# Patient Record
Sex: Female | Born: 1950 | Race: White | Hispanic: No | Marital: Married | Smoking: Never smoker
Health system: Southern US, Community
[De-identification: ages and names within clinical notes are randomized; demographics above are authoritative.]

## PROBLEM LIST (undated history)

## (undated) DIAGNOSIS — R112 Nausea with vomiting, unspecified: Secondary | ICD-10-CM

## (undated) DIAGNOSIS — Z9889 Other specified postprocedural states: Secondary | ICD-10-CM

## (undated) DIAGNOSIS — F419 Anxiety disorder, unspecified: Secondary | ICD-10-CM

## (undated) DIAGNOSIS — E7800 Pure hypercholesterolemia, unspecified: Secondary | ICD-10-CM

## (undated) DIAGNOSIS — G250 Essential tremor: Secondary | ICD-10-CM

## (undated) HISTORY — DX: Pure hypercholesterolemia, unspecified: E78.00

## (undated) HISTORY — DX: Anxiety disorder, unspecified: F41.9

## (undated) HISTORY — PX: TUBAL LIGATION: SHX77

---

## 1999-10-28 ENCOUNTER — Encounter: Admission: RE | Admit: 1999-10-28 | Discharge: 1999-11-19 | Payer: Self-pay | Admitting: Orthopaedic Surgery

## 2004-07-04 ENCOUNTER — Ambulatory Visit: Payer: Self-pay | Admitting: Family Medicine

## 2004-10-30 ENCOUNTER — Ambulatory Visit: Payer: Self-pay | Admitting: Family Medicine

## 2005-04-14 ENCOUNTER — Ambulatory Visit: Payer: Self-pay | Admitting: Family Medicine

## 2005-06-03 ENCOUNTER — Ambulatory Visit: Payer: Self-pay | Admitting: Family Medicine

## 2006-03-31 ENCOUNTER — Ambulatory Visit: Payer: Self-pay | Admitting: Family Medicine

## 2014-01-02 DIAGNOSIS — F411 Generalized anxiety disorder: Secondary | ICD-10-CM | POA: Insufficient documentation

## 2014-01-30 DIAGNOSIS — J069 Acute upper respiratory infection, unspecified: Secondary | ICD-10-CM | POA: Insufficient documentation

## 2014-05-01 DIAGNOSIS — E785 Hyperlipidemia, unspecified: Secondary | ICD-10-CM | POA: Insufficient documentation

## 2014-08-21 ENCOUNTER — Ambulatory Visit: Payer: 59 | Admitting: Neurology

## 2014-09-04 ENCOUNTER — Encounter: Payer: Self-pay | Admitting: Neurology

## 2014-09-04 ENCOUNTER — Ambulatory Visit (INDEPENDENT_AMBULATORY_CARE_PROVIDER_SITE_OTHER): Payer: 59 | Admitting: Neurology

## 2014-09-04 VITALS — BP 155/82 | HR 79 | Temp 97.9°F | Ht 67.0 in | Wt 152.0 lb

## 2014-09-04 DIAGNOSIS — G250 Essential tremor: Secondary | ICD-10-CM | POA: Insufficient documentation

## 2014-09-04 DIAGNOSIS — F329 Major depressive disorder, single episode, unspecified: Secondary | ICD-10-CM

## 2014-09-04 DIAGNOSIS — F32A Depression, unspecified: Secondary | ICD-10-CM | POA: Insufficient documentation

## 2014-09-04 DIAGNOSIS — R251 Tremor, unspecified: Secondary | ICD-10-CM

## 2014-09-04 DIAGNOSIS — F411 Generalized anxiety disorder: Secondary | ICD-10-CM | POA: Insufficient documentation

## 2014-09-04 DIAGNOSIS — G4700 Insomnia, unspecified: Secondary | ICD-10-CM | POA: Diagnosis not present

## 2014-09-04 MED ORDER — PROPRANOLOL HCL ER 80 MG PO CP24: 80.0000 mg | ORAL_CAPSULE | Freq: Every day | ORAL | Status: DC

## 2014-09-04 NOTE — Progress Notes (Signed)
[facility]    Provider:  Dr Jaynee Eagles Referring Provider: Lars Mage Primary Care Physician:  No primary care provider on file.  CC:  tremor  HPI:  Rachael Bryant is a 64 y.o. female here as a referral from for tremor. PMHx depression, anxiety, HLD. Tremor started several years ago and worsening. It is in both hands. It is low, fast tremor. Worse with use such as when she has a cup of coffee, she has to hold it with both hands to keep it from sloshing. Worse with anxiety. Both daughters have tremors as well. One daughter is being treated with propranolol for her tremor. She is worried about Parkinson's disease because her sister has it. Patient's voice not hypophonic. No drooling. No falls, or shuffling, not slow. No loss of smell, no constipation. The shaking is interfering with her writing however. She doesn't drink acohol. Never tried anything for the tremor.  She takes benadryl every night for years to sleep. Feels tremor is worsening. Is present most of the time. No other focal neurologic complaints.    Review of Systems: Patient complains of symptoms per HPI as well as the following symptoms: insomnia, sleepiness, not enough sleep, joint pain. Pertinent negatives per HPI. All others negative.   History   Social History  . Marital Status: Married    Spouse Name: Remo Lipps   . Number of Children: 2  . Years of Education: 12   Occupational History  . Not on file.   Social History Main Topics  . Smoking status: Never Smoker   . Smokeless tobacco: Not on file  . Alcohol Use: No  . Drug Use: No  . Sexual Activity: Not on file   Other Topics Concern  . Not on file   Social History Narrative   Lives at home with husband, Remo Lipps.   Caffeine use: Drinks coffee/soda (7 cups per week)   Drinks 2 Liters per week    Family History  Problem Relation Age of Onset  . Heart attack Father   . Diabetes Mother   . Pulmonary fibrosis Mother     Past  Medical History  Diagnosis Date  . High cholesterol   . Anxiety     Past Surgical History  Procedure Laterality Date  . Tubal ligation      Current Outpatient Prescriptions  Medication Sig Dispense Refill  . citalopram (CELEXA) 20 MG tablet Take 0.5 tablets by mouth daily.    . diphenhydrAMINE (BENADRYL) 25 mg capsule Take 2 tablets by mouth daily.    . simvastatin (ZOCOR) 20 MG tablet Take 20 mg by mouth daily.    . propranolol ER (INDERAL LA) 80 MG 24 hr capsule Take 1 capsule (80 mg total) by mouth daily. 30 capsule 6   No current facility-administered medications for this visit.    Allergies as of 09/04/2014 - Review Complete 09/04/2014  Allergen Reaction Noted  . Acetaminophen Hives 09/04/2014  . Ibuprofen Hives 09/04/2014  . Other Hives 09/04/2014  . Codeine Nausea Only 09/04/2014    Vitals: BP 155/82 mmHg  Pulse 79  Temp(Src) 97.9 F (36.6 C) (Oral)  Ht 5\' 7"  (1.702 m)  Wt 152 lb (68.947 kg)  BMI 23.80 kg/m2 Last Weight:  Wt Readings from Last 1 Encounters:  09/04/14 152 lb (68.947 kg)   Last Height:   Ht Readings from Last 1 Encounters:  09/04/14 5\' 7"  (1.702 m)    Physical exam: Exam: Gen: NAD, conversant, well nourised, well groomed  CV: RRR, no MRG. No Carotid Bruits. No peripheral edema, warm, nontender Eyes: Conjunctivae clear without exudates or hemorrhage  Neuro: Detailed Neurologic Exam  Speech:    Speech is normal; fluent and spontaneous with normal comprehension.  Cognition:    The patient is oriented to person, place, and time;     recent and remote memory intact;     language fluent;     normal attention, concentration,     fund of knowledge Cranial Nerves:    The pupils are equal, round, and reactive to light. The fundi are normal and spontaneous venous pulsations are present. Visual fields are full to finger confrontation. Extraocular movements are intact. Trigeminal sensation is intact and the muscles of  mastication are normal. The face is symmetric. The palate elevates in the midline. Hearing intact. Voice is normal. Shoulder shrug is normal. The tongue has normal motion without fasciculations.   Coordination:    Normal finger to nose and heel to shin. Normal rapid alternating movements.   Gait:    Heel-toe and tandem gait are normal.   Motor Observation:    Postural tremor, high frequency, low amplitude Tone:    Normal muscle tone.    Posture:    Posture is normal. normal erect    Strength:    Strength is V/V in the upper and lower limbs.      Sensation: intact to LT     Reflex Exam:  DTR's:    Deep tendon reflexes in the upper and lower extremities are normal bilaterally.   Toes:    The toes are downgoing bilaterally.   Clonus:    Clonus is absent.      Assessment/Plan:  64 year old very lovely female. She has what appears to be an essential tremor. Advised to stop any caffeine use. Can start propranolol and see if that helps. Will order TSH. Discussed common side effects such as fatigue, dizziness, constipation, hypotension, bradycardia as well as serious cardiac complications. Will start low. Stop for anything concerning.  Sarina Ill, MD  [facility] [address] [place], [place] [address]  Phone [phone] Fax [phone]

## 2014-09-04 NOTE — Patient Instructions (Signed)
Overall you are doing fairly well but I do want to suggest a few things today:   Remember to drink plenty of fluid, eat healthy meals and do not skip any meals. Try to eat protein with a every meal and eat a healthy snack such as fruit or nuts in between meals. Try to keep a regular sleep-wake schedule and try to exercise daily, particularly in the form of walking, 20-30 minutes a day, if you can.   As far as your medications are concerned, I would like to suggest: Inderal 80mg  daily  I would like to see you back in 4 months, sooner if we need to. Please call us with any interim questions, concerns, problems, updates or refill requests.   Our phone number is [phone]. We also have an after hours call service for urgent matters and there is a physician on-call for urgent questions. For any emergencies you know to call 911 or go to the nearest emergency room

## 2014-09-05 LAB — TSH: TSH: 0.33 u[IU]/mL — AB (ref 0.450–4.500)

## 2014-09-06 ENCOUNTER — Telehealth: Payer: Self-pay | Admitting: *Deleted

## 2014-09-06 NOTE — Telephone Encounter (Signed)
Left VM for the pt to call back about lab results. Gave [facility] phone number and office hours.

## 2014-09-06 NOTE — Telephone Encounter (Signed)
Spoke w/ patient about thyroid results. Told her it was low at 0.33 and normal is 0.45-4.50 which is considered hyperthyroidism. She needs to f/u with her PCP and call them. Told her Dr. Jaynee Eagles forwarded the results to her PCP as well. She verbalized understanding.

## 2014-11-27 DIAGNOSIS — R7309 Other abnormal glucose: Secondary | ICD-10-CM | POA: Insufficient documentation

## 2015-01-09 ENCOUNTER — Encounter: Payer: Self-pay | Admitting: Neurology

## 2015-01-09 ENCOUNTER — Ambulatory Visit (INDEPENDENT_AMBULATORY_CARE_PROVIDER_SITE_OTHER): Payer: 59 | Admitting: Neurology

## 2015-01-09 VITALS — BP 133/83 | HR 66 | Ht 67.0 in | Wt 157.2 lb

## 2015-01-09 DIAGNOSIS — G250 Essential tremor: Secondary | ICD-10-CM | POA: Diagnosis not present

## 2015-01-09 MED ORDER — PROPRANOLOL HCL ER 80 MG PO CP24: 80.0000 mg | ORAL_CAPSULE | Freq: Every day | ORAL | Status: DC

## 2015-01-09 NOTE — Progress Notes (Signed)
[facility]    Provider:  Dr Jaynee Eagles Referring Provider: No ref. provider found Primary Care Physician:  No primary care provider on file.  CC: tremor  Interval update 01/09/2015: Tremor is improved. She is very happy. No lightheadedness, no chest pain. She has gained a few pounds. Otherwise no side effects.  HPI: Rachael Bryant is a 64 y.o. female here as a referral from for tremor. PMHx depression, anxiety, HLD. Tremor started several years ago and worsening. It is in both hands. It is low, fast tremor. Worse with use such as when she has a cup of coffee, she has to hold it with both hands to keep it from sloshing. Worse with anxiety. Both daughters have tremors as well. One daughter is being treated with propranolol for her tremor. She is worried about Parkinson's disease because her sister has it. Patient's voice not hypophonic. No drooling. No falls, or shuffling, not slow. No loss of smell, no constipation. The shaking is interfering with her writing however. She doesn't drink acohol. Never tried anything for the tremor. She takes benadryl every night for years to sleep. Feels tremor is worsening. Is present most of the time. No other focal neurologic complaints.    Review of Systems: Patient complains of symptoms per HPI as well as the following symptoms: insomnia, sleepiness, not enough sleep, joint pain. Pertinent negatives per HPI. All others negative.   Social History   Social History  . Marital Status: Married    Spouse Name: Remo Lipps   . Number of Children: 2  . Years of Education: 12   Occupational History  . Not on file.   Social History Main Topics  . Smoking status: Never Smoker   . Smokeless tobacco: Not on file  . Alcohol Use: No  . Drug Use: No  . Sexual Activity: Not on file   Other Topics Concern  . Not on file   Social History Narrative   Lives at home with husband, Remo Lipps.   Caffeine use: Drinks coffee/soda (7 cups per week)   Drinks 2 Liters per week    Family History  Problem Relation Age of Onset  . Heart attack Father   . Diabetes Mother   . Pulmonary fibrosis Mother   . Parkinsonism Sister     Past Medical History  Diagnosis Date  . High cholesterol   . Anxiety     Past Surgical History  Procedure Laterality Date  . Tubal ligation      Current Outpatient Prescriptions  Medication Sig Dispense Refill  . citalopram (CELEXA) 20 MG tablet Take 0.5 tablets by mouth daily.    . diphenhydrAMINE (BENADRYL) 25 mg capsule Take 2 tablets by mouth daily.    . propranolol ER (INDERAL LA) 80 MG 24 hr capsule Take 1 capsule (80 mg total) by mouth daily. 30 capsule 6  . simvastatin (ZOCOR) 20 MG tablet Take 20 mg by mouth daily.     No current facility-administered medications for this visit.    Allergies as of 01/09/2015 - Review Complete 09/04/2014  Allergen Reaction Noted  . Acetaminophen Hives 09/04/2014  . Ibuprofen Hives 09/04/2014  . Other Hives 09/04/2014  . Codeine Nausea Only 09/04/2014    Vitals: There were no vitals taken for this visit. Last Weight:  Wt Readings from Last 1 Encounters:  09/04/14 152 lb (68.947 kg)   Last Height:   Ht Readings from Last 1 Encounters:  09/04/14 5\' 7"  (1.702 m)    Cranial Nerves:  The pupils are equal, round, and reactive to light. The fundi are normal and spontaneous venous pulsations are present. Visual fields are full to finger confrontation. Extraocular movements are intact. Trigeminal sensation is intact and the muscles of mastication are normal. The face is symmetric. The palate elevates in the midline. Hearing intact. Voice is normal. Shoulder shrug is normal. The tongue has normal motion without fasciculations.   Coordination:  Normal finger to nose and heel to shin. Normal rapid alternating movements.   Gait:  Heel-toe and tandem gait are normal.   Motor Observation:  Postural tremor, high frequency, low amplitude  improved Tone:  Normal muscle tone.   Posture:  Posture is normal. normal erect   Strength:  Strength is V/V in the upper and lower limbs.    Sensation: intact to LT   Reflex Exam:  DTR's:  Deep tendon reflexes in the upper and lower extremities are normal bilaterally.  Toes:  The toes are downgoing bilaterally.  Clonus:  Clonus is absent.     Assessment/Plan: 64 year old very lovely female. She has what appears to be an essential tremor. Advised to stop any caffeine use. Can start propranolol and see if that helps. Will order TSH. Discussed common side effects such as fatigue, dizziness, constipation, hypotension, bradycardia as well as serious cardiac complications. Will start low. Stop for anything concerning.  Propranolol helping. She is happy. No side effects. Continue at current dose. If needed, can increase to 120mg  ER.   Sarina Ill, MD  [facility] [address] [place], [place] [address]  Phone [phone] Fax [phone]  A total of 15 minutes was spent face-to-face with this patient. Over half this time was spent on counseling patient on the essential tremor diagnosis and different diagnostic and therapeutic options available.

## 2015-08-06 ENCOUNTER — Other Ambulatory Visit: Payer: Self-pay

## 2015-08-06 MED ORDER — PROPRANOLOL HCL ER 80 MG PO CP24: 80.0000 mg | ORAL_CAPSULE | Freq: Every day | ORAL | Status: DC

## 2015-08-07 ENCOUNTER — Encounter: Payer: Self-pay | Admitting: *Deleted

## 2015-08-07 NOTE — Progress Notes (Signed)
Faxed printed rx propranolol to pt pharmacy. Fax: [phone]. Received confirmation.

## 2016-02-16 ENCOUNTER — Other Ambulatory Visit: Payer: Self-pay | Admitting: Neurology

## 2016-03-03 ENCOUNTER — Other Ambulatory Visit: Payer: Self-pay | Admitting: Neurology

## 2016-03-03 NOTE — Telephone Encounter (Signed)
Called and spoke to pt. Patient states she is going great, no new or worsening symptoms. Advised per AA,MD we can send in 3 month supply, but any further refills need to be requested from her PCP or she needs to come back to our office for a f/u to receive further refills. She verbalized understanding.

## 2016-05-28 ENCOUNTER — Other Ambulatory Visit: Payer: Self-pay | Admitting: Neurology

## 2016-09-25 ENCOUNTER — Encounter (INDEPENDENT_AMBULATORY_CARE_PROVIDER_SITE_OTHER): Payer: BLUE CROSS/BLUE SHIELD | Admitting: Ophthalmology

## 2016-09-25 DIAGNOSIS — H43813 Vitreous degeneration, bilateral: Secondary | ICD-10-CM

## 2016-09-25 DIAGNOSIS — H353211 Exudative age-related macular degeneration, right eye, with active choroidal neovascularization: Secondary | ICD-10-CM

## 2016-09-25 DIAGNOSIS — H2513 Age-related nuclear cataract, bilateral: Secondary | ICD-10-CM | POA: Diagnosis not present

## 2016-10-22 ENCOUNTER — Encounter (INDEPENDENT_AMBULATORY_CARE_PROVIDER_SITE_OTHER): Payer: BLUE CROSS/BLUE SHIELD | Admitting: Ophthalmology

## 2016-10-22 DIAGNOSIS — H43813 Vitreous degeneration, bilateral: Secondary | ICD-10-CM

## 2016-10-22 DIAGNOSIS — H353211 Exudative age-related macular degeneration, right eye, with active choroidal neovascularization: Secondary | ICD-10-CM | POA: Diagnosis not present

## 2016-10-22 DIAGNOSIS — H353121 Nonexudative age-related macular degeneration, left eye, early dry stage: Secondary | ICD-10-CM | POA: Diagnosis not present

## 2016-10-22 DIAGNOSIS — H2513 Age-related nuclear cataract, bilateral: Secondary | ICD-10-CM

## 2016-11-19 ENCOUNTER — Encounter (INDEPENDENT_AMBULATORY_CARE_PROVIDER_SITE_OTHER): Payer: BLUE CROSS/BLUE SHIELD | Admitting: Ophthalmology

## 2016-11-19 DIAGNOSIS — H2513 Age-related nuclear cataract, bilateral: Secondary | ICD-10-CM

## 2016-11-19 DIAGNOSIS — H353211 Exudative age-related macular degeneration, right eye, with active choroidal neovascularization: Secondary | ICD-10-CM

## 2016-11-19 DIAGNOSIS — H43813 Vitreous degeneration, bilateral: Secondary | ICD-10-CM

## 2016-12-17 ENCOUNTER — Encounter (INDEPENDENT_AMBULATORY_CARE_PROVIDER_SITE_OTHER): Payer: BLUE CROSS/BLUE SHIELD | Admitting: Ophthalmology

## 2016-12-17 DIAGNOSIS — H353211 Exudative age-related macular degeneration, right eye, with active choroidal neovascularization: Secondary | ICD-10-CM

## 2016-12-17 DIAGNOSIS — H2513 Age-related nuclear cataract, bilateral: Secondary | ICD-10-CM | POA: Diagnosis not present

## 2016-12-17 DIAGNOSIS — H353121 Nonexudative age-related macular degeneration, left eye, early dry stage: Secondary | ICD-10-CM

## 2016-12-17 DIAGNOSIS — H43813 Vitreous degeneration, bilateral: Secondary | ICD-10-CM

## 2017-01-14 ENCOUNTER — Encounter (INDEPENDENT_AMBULATORY_CARE_PROVIDER_SITE_OTHER): Payer: BLUE CROSS/BLUE SHIELD | Admitting: Ophthalmology

## 2017-01-14 DIAGNOSIS — H2513 Age-related nuclear cataract, bilateral: Secondary | ICD-10-CM

## 2017-01-14 DIAGNOSIS — H353211 Exudative age-related macular degeneration, right eye, with active choroidal neovascularization: Secondary | ICD-10-CM

## 2017-01-14 DIAGNOSIS — H353121 Nonexudative age-related macular degeneration, left eye, early dry stage: Secondary | ICD-10-CM

## 2017-01-14 DIAGNOSIS — H43813 Vitreous degeneration, bilateral: Secondary | ICD-10-CM | POA: Diagnosis not present

## 2017-02-11 ENCOUNTER — Encounter (INDEPENDENT_AMBULATORY_CARE_PROVIDER_SITE_OTHER): Payer: BLUE CROSS/BLUE SHIELD | Admitting: Ophthalmology

## 2017-02-11 DIAGNOSIS — H353121 Nonexudative age-related macular degeneration, left eye, early dry stage: Secondary | ICD-10-CM

## 2017-02-11 DIAGNOSIS — H2513 Age-related nuclear cataract, bilateral: Secondary | ICD-10-CM

## 2017-02-11 DIAGNOSIS — H353211 Exudative age-related macular degeneration, right eye, with active choroidal neovascularization: Secondary | ICD-10-CM | POA: Diagnosis not present

## 2017-02-11 DIAGNOSIS — H43813 Vitreous degeneration, bilateral: Secondary | ICD-10-CM | POA: Diagnosis not present

## 2017-03-11 ENCOUNTER — Encounter (INDEPENDENT_AMBULATORY_CARE_PROVIDER_SITE_OTHER): Payer: BLUE CROSS/BLUE SHIELD | Admitting: Ophthalmology

## 2017-03-16 ENCOUNTER — Encounter (INDEPENDENT_AMBULATORY_CARE_PROVIDER_SITE_OTHER): Payer: BLUE CROSS/BLUE SHIELD | Admitting: Ophthalmology

## 2017-03-16 DIAGNOSIS — H353121 Nonexudative age-related macular degeneration, left eye, early dry stage: Secondary | ICD-10-CM

## 2017-03-16 DIAGNOSIS — H2513 Age-related nuclear cataract, bilateral: Secondary | ICD-10-CM | POA: Diagnosis not present

## 2017-03-16 DIAGNOSIS — H43813 Vitreous degeneration, bilateral: Secondary | ICD-10-CM

## 2017-03-16 DIAGNOSIS — H353211 Exudative age-related macular degeneration, right eye, with active choroidal neovascularization: Secondary | ICD-10-CM | POA: Diagnosis not present

## 2017-04-13 ENCOUNTER — Encounter (INDEPENDENT_AMBULATORY_CARE_PROVIDER_SITE_OTHER): Payer: BLUE CROSS/BLUE SHIELD | Admitting: Ophthalmology

## 2017-04-13 DIAGNOSIS — H353121 Nonexudative age-related macular degeneration, left eye, early dry stage: Secondary | ICD-10-CM

## 2017-04-13 DIAGNOSIS — H353211 Exudative age-related macular degeneration, right eye, with active choroidal neovascularization: Secondary | ICD-10-CM | POA: Diagnosis not present

## 2017-04-13 DIAGNOSIS — H2513 Age-related nuclear cataract, bilateral: Secondary | ICD-10-CM | POA: Diagnosis not present

## 2017-04-13 DIAGNOSIS — H43813 Vitreous degeneration, bilateral: Secondary | ICD-10-CM

## 2017-05-11 ENCOUNTER — Encounter (INDEPENDENT_AMBULATORY_CARE_PROVIDER_SITE_OTHER): Payer: BLUE CROSS/BLUE SHIELD | Admitting: Ophthalmology

## 2017-05-11 DIAGNOSIS — H353121 Nonexudative age-related macular degeneration, left eye, early dry stage: Secondary | ICD-10-CM

## 2017-05-11 DIAGNOSIS — H43813 Vitreous degeneration, bilateral: Secondary | ICD-10-CM | POA: Diagnosis not present

## 2017-05-11 DIAGNOSIS — H2513 Age-related nuclear cataract, bilateral: Secondary | ICD-10-CM | POA: Diagnosis not present

## 2017-05-11 DIAGNOSIS — H353211 Exudative age-related macular degeneration, right eye, with active choroidal neovascularization: Secondary | ICD-10-CM | POA: Diagnosis not present

## 2017-06-08 ENCOUNTER — Encounter (INDEPENDENT_AMBULATORY_CARE_PROVIDER_SITE_OTHER): Payer: BLUE CROSS/BLUE SHIELD | Admitting: Ophthalmology

## 2017-06-08 DIAGNOSIS — H2513 Age-related nuclear cataract, bilateral: Secondary | ICD-10-CM

## 2017-06-08 DIAGNOSIS — H353211 Exudative age-related macular degeneration, right eye, with active choroidal neovascularization: Secondary | ICD-10-CM | POA: Diagnosis not present

## 2017-06-08 DIAGNOSIS — H353121 Nonexudative age-related macular degeneration, left eye, early dry stage: Secondary | ICD-10-CM

## 2017-06-08 DIAGNOSIS — H43813 Vitreous degeneration, bilateral: Secondary | ICD-10-CM | POA: Diagnosis not present

## 2017-07-07 ENCOUNTER — Encounter (INDEPENDENT_AMBULATORY_CARE_PROVIDER_SITE_OTHER): Payer: BLUE CROSS/BLUE SHIELD | Admitting: Ophthalmology

## 2017-07-07 DIAGNOSIS — H43813 Vitreous degeneration, bilateral: Secondary | ICD-10-CM

## 2017-07-07 DIAGNOSIS — H353121 Nonexudative age-related macular degeneration, left eye, early dry stage: Secondary | ICD-10-CM | POA: Diagnosis not present

## 2017-07-07 DIAGNOSIS — H353211 Exudative age-related macular degeneration, right eye, with active choroidal neovascularization: Secondary | ICD-10-CM

## 2017-08-03 ENCOUNTER — Encounter (INDEPENDENT_AMBULATORY_CARE_PROVIDER_SITE_OTHER): Payer: BLUE CROSS/BLUE SHIELD | Admitting: Ophthalmology

## 2017-08-03 DIAGNOSIS — H2513 Age-related nuclear cataract, bilateral: Secondary | ICD-10-CM | POA: Diagnosis not present

## 2017-08-03 DIAGNOSIS — H353211 Exudative age-related macular degeneration, right eye, with active choroidal neovascularization: Secondary | ICD-10-CM | POA: Diagnosis not present

## 2017-08-03 DIAGNOSIS — H43813 Vitreous degeneration, bilateral: Secondary | ICD-10-CM

## 2017-08-03 DIAGNOSIS — H353121 Nonexudative age-related macular degeneration, left eye, early dry stage: Secondary | ICD-10-CM | POA: Diagnosis not present

## 2017-08-31 ENCOUNTER — Encounter (INDEPENDENT_AMBULATORY_CARE_PROVIDER_SITE_OTHER): Payer: BLUE CROSS/BLUE SHIELD | Admitting: Ophthalmology

## 2017-08-31 DIAGNOSIS — H353121 Nonexudative age-related macular degeneration, left eye, early dry stage: Secondary | ICD-10-CM

## 2017-08-31 DIAGNOSIS — H2513 Age-related nuclear cataract, bilateral: Secondary | ICD-10-CM | POA: Diagnosis not present

## 2017-08-31 DIAGNOSIS — H43813 Vitreous degeneration, bilateral: Secondary | ICD-10-CM | POA: Diagnosis not present

## 2017-08-31 DIAGNOSIS — H353211 Exudative age-related macular degeneration, right eye, with active choroidal neovascularization: Secondary | ICD-10-CM

## 2017-09-28 ENCOUNTER — Encounter (INDEPENDENT_AMBULATORY_CARE_PROVIDER_SITE_OTHER): Payer: BLUE CROSS/BLUE SHIELD | Admitting: Ophthalmology

## 2017-10-05 ENCOUNTER — Encounter (INDEPENDENT_AMBULATORY_CARE_PROVIDER_SITE_OTHER): Payer: Medicare Other | Admitting: Ophthalmology

## 2017-10-05 DIAGNOSIS — H353211 Exudative age-related macular degeneration, right eye, with active choroidal neovascularization: Secondary | ICD-10-CM

## 2017-10-05 DIAGNOSIS — H353111 Nonexudative age-related macular degeneration, right eye, early dry stage: Secondary | ICD-10-CM

## 2017-10-05 DIAGNOSIS — H43813 Vitreous degeneration, bilateral: Secondary | ICD-10-CM | POA: Diagnosis not present

## 2017-11-02 ENCOUNTER — Encounter (INDEPENDENT_AMBULATORY_CARE_PROVIDER_SITE_OTHER): Payer: Medicare Other | Admitting: Ophthalmology

## 2017-11-02 DIAGNOSIS — H43813 Vitreous degeneration, bilateral: Secondary | ICD-10-CM

## 2017-11-02 DIAGNOSIS — H353211 Exudative age-related macular degeneration, right eye, with active choroidal neovascularization: Secondary | ICD-10-CM | POA: Diagnosis not present

## 2017-11-02 DIAGNOSIS — H353121 Nonexudative age-related macular degeneration, left eye, early dry stage: Secondary | ICD-10-CM

## 2017-11-02 DIAGNOSIS — H2513 Age-related nuclear cataract, bilateral: Secondary | ICD-10-CM

## 2017-11-30 ENCOUNTER — Encounter (INDEPENDENT_AMBULATORY_CARE_PROVIDER_SITE_OTHER): Payer: Medicare Other | Admitting: Ophthalmology

## 2017-11-30 DIAGNOSIS — H43813 Vitreous degeneration, bilateral: Secondary | ICD-10-CM | POA: Diagnosis not present

## 2017-11-30 DIAGNOSIS — H353121 Nonexudative age-related macular degeneration, left eye, early dry stage: Secondary | ICD-10-CM

## 2017-11-30 DIAGNOSIS — H2513 Age-related nuclear cataract, bilateral: Secondary | ICD-10-CM

## 2017-11-30 DIAGNOSIS — H353211 Exudative age-related macular degeneration, right eye, with active choroidal neovascularization: Secondary | ICD-10-CM

## 2017-12-28 ENCOUNTER — Encounter (INDEPENDENT_AMBULATORY_CARE_PROVIDER_SITE_OTHER): Payer: Medicare Other | Admitting: Ophthalmology

## 2017-12-28 DIAGNOSIS — H43813 Vitreous degeneration, bilateral: Secondary | ICD-10-CM | POA: Diagnosis not present

## 2017-12-28 DIAGNOSIS — H353121 Nonexudative age-related macular degeneration, left eye, early dry stage: Secondary | ICD-10-CM

## 2017-12-28 DIAGNOSIS — H353211 Exudative age-related macular degeneration, right eye, with active choroidal neovascularization: Secondary | ICD-10-CM

## 2017-12-28 DIAGNOSIS — H2513 Age-related nuclear cataract, bilateral: Secondary | ICD-10-CM

## 2018-01-11 DIAGNOSIS — N951 Menopausal and female climacteric states: Secondary | ICD-10-CM | POA: Insufficient documentation

## 2018-01-25 ENCOUNTER — Encounter (INDEPENDENT_AMBULATORY_CARE_PROVIDER_SITE_OTHER): Payer: Medicare Other | Admitting: Ophthalmology

## 2018-01-25 DIAGNOSIS — H353211 Exudative age-related macular degeneration, right eye, with active choroidal neovascularization: Secondary | ICD-10-CM

## 2018-01-25 DIAGNOSIS — H43813 Vitreous degeneration, bilateral: Secondary | ICD-10-CM | POA: Diagnosis not present

## 2018-01-25 DIAGNOSIS — H2513 Age-related nuclear cataract, bilateral: Secondary | ICD-10-CM

## 2018-02-22 ENCOUNTER — Encounter (INDEPENDENT_AMBULATORY_CARE_PROVIDER_SITE_OTHER): Payer: Medicare Other | Admitting: Ophthalmology

## 2018-02-22 DIAGNOSIS — H353211 Exudative age-related macular degeneration, right eye, with active choroidal neovascularization: Secondary | ICD-10-CM | POA: Diagnosis not present

## 2018-02-22 DIAGNOSIS — H353121 Nonexudative age-related macular degeneration, left eye, early dry stage: Secondary | ICD-10-CM

## 2018-02-22 DIAGNOSIS — H43813 Vitreous degeneration, bilateral: Secondary | ICD-10-CM

## 2018-02-25 DIAGNOSIS — M810 Age-related osteoporosis without current pathological fracture: Secondary | ICD-10-CM | POA: Insufficient documentation

## 2018-03-22 ENCOUNTER — Encounter (INDEPENDENT_AMBULATORY_CARE_PROVIDER_SITE_OTHER): Payer: Medicare Other | Admitting: Ophthalmology

## 2018-03-22 DIAGNOSIS — H353211 Exudative age-related macular degeneration, right eye, with active choroidal neovascularization: Secondary | ICD-10-CM

## 2018-03-22 DIAGNOSIS — H353121 Nonexudative age-related macular degeneration, left eye, early dry stage: Secondary | ICD-10-CM

## 2018-03-22 DIAGNOSIS — H43813 Vitreous degeneration, bilateral: Secondary | ICD-10-CM | POA: Diagnosis not present

## 2018-03-22 DIAGNOSIS — H2513 Age-related nuclear cataract, bilateral: Secondary | ICD-10-CM | POA: Diagnosis not present

## 2018-04-19 ENCOUNTER — Encounter (INDEPENDENT_AMBULATORY_CARE_PROVIDER_SITE_OTHER): Payer: Medicare Other | Admitting: Ophthalmology

## 2018-04-19 DIAGNOSIS — H2513 Age-related nuclear cataract, bilateral: Secondary | ICD-10-CM | POA: Diagnosis not present

## 2018-04-19 DIAGNOSIS — H43813 Vitreous degeneration, bilateral: Secondary | ICD-10-CM | POA: Diagnosis not present

## 2018-04-19 DIAGNOSIS — H353121 Nonexudative age-related macular degeneration, left eye, early dry stage: Secondary | ICD-10-CM | POA: Diagnosis not present

## 2018-04-19 DIAGNOSIS — H353211 Exudative age-related macular degeneration, right eye, with active choroidal neovascularization: Secondary | ICD-10-CM

## 2018-05-14 ENCOUNTER — Encounter (INDEPENDENT_AMBULATORY_CARE_PROVIDER_SITE_OTHER): Payer: Medicare Other | Admitting: Ophthalmology

## 2018-05-14 ENCOUNTER — Other Ambulatory Visit: Payer: Self-pay

## 2018-05-14 DIAGNOSIS — H43813 Vitreous degeneration, bilateral: Secondary | ICD-10-CM

## 2018-05-14 DIAGNOSIS — H2513 Age-related nuclear cataract, bilateral: Secondary | ICD-10-CM | POA: Diagnosis not present

## 2018-05-14 DIAGNOSIS — H353211 Exudative age-related macular degeneration, right eye, with active choroidal neovascularization: Secondary | ICD-10-CM | POA: Diagnosis not present

## 2018-05-14 DIAGNOSIS — H353121 Nonexudative age-related macular degeneration, left eye, early dry stage: Secondary | ICD-10-CM | POA: Diagnosis not present

## 2018-06-11 ENCOUNTER — Other Ambulatory Visit: Payer: Self-pay

## 2018-06-11 ENCOUNTER — Encounter (INDEPENDENT_AMBULATORY_CARE_PROVIDER_SITE_OTHER): Payer: Medicare Other | Admitting: Ophthalmology

## 2018-06-11 DIAGNOSIS — H43813 Vitreous degeneration, bilateral: Secondary | ICD-10-CM | POA: Diagnosis not present

## 2018-06-11 DIAGNOSIS — H353112 Nonexudative age-related macular degeneration, right eye, intermediate dry stage: Secondary | ICD-10-CM | POA: Diagnosis not present

## 2018-06-11 DIAGNOSIS — H353211 Exudative age-related macular degeneration, right eye, with active choroidal neovascularization: Secondary | ICD-10-CM

## 2018-06-11 DIAGNOSIS — H2513 Age-related nuclear cataract, bilateral: Secondary | ICD-10-CM | POA: Diagnosis not present

## 2018-07-09 ENCOUNTER — Encounter (INDEPENDENT_AMBULATORY_CARE_PROVIDER_SITE_OTHER): Payer: Medicare Other | Admitting: Ophthalmology

## 2018-07-09 ENCOUNTER — Other Ambulatory Visit: Payer: Self-pay

## 2018-07-09 DIAGNOSIS — H353211 Exudative age-related macular degeneration, right eye, with active choroidal neovascularization: Secondary | ICD-10-CM

## 2018-07-09 DIAGNOSIS — H2513 Age-related nuclear cataract, bilateral: Secondary | ICD-10-CM

## 2018-07-09 DIAGNOSIS — H43813 Vitreous degeneration, bilateral: Secondary | ICD-10-CM | POA: Diagnosis not present

## 2018-07-09 DIAGNOSIS — H353121 Nonexudative age-related macular degeneration, left eye, early dry stage: Secondary | ICD-10-CM | POA: Diagnosis not present

## 2018-08-06 ENCOUNTER — Other Ambulatory Visit: Payer: Self-pay

## 2018-08-06 ENCOUNTER — Encounter (INDEPENDENT_AMBULATORY_CARE_PROVIDER_SITE_OTHER): Payer: Medicare Other | Admitting: Ophthalmology

## 2018-08-06 DIAGNOSIS — H2513 Age-related nuclear cataract, bilateral: Secondary | ICD-10-CM | POA: Diagnosis not present

## 2018-08-06 DIAGNOSIS — H353121 Nonexudative age-related macular degeneration, left eye, early dry stage: Secondary | ICD-10-CM

## 2018-08-06 DIAGNOSIS — H43813 Vitreous degeneration, bilateral: Secondary | ICD-10-CM | POA: Diagnosis not present

## 2018-08-06 DIAGNOSIS — H353211 Exudative age-related macular degeneration, right eye, with active choroidal neovascularization: Secondary | ICD-10-CM

## 2018-08-17 ENCOUNTER — Other Ambulatory Visit: Payer: Self-pay | Admitting: Family Medicine

## 2018-08-17 NOTE — Telephone Encounter (Signed)
Pt was seeing Nyland, has New Pt appt with AJ 09/10/18. Ok to refill?

## 2018-09-03 ENCOUNTER — Other Ambulatory Visit: Payer: Self-pay

## 2018-09-03 ENCOUNTER — Encounter (INDEPENDENT_AMBULATORY_CARE_PROVIDER_SITE_OTHER): Payer: Medicare Other | Admitting: Ophthalmology

## 2018-09-03 DIAGNOSIS — H2513 Age-related nuclear cataract, bilateral: Secondary | ICD-10-CM | POA: Diagnosis not present

## 2018-09-03 DIAGNOSIS — H353121 Nonexudative age-related macular degeneration, left eye, early dry stage: Secondary | ICD-10-CM

## 2018-09-03 DIAGNOSIS — H353211 Exudative age-related macular degeneration, right eye, with active choroidal neovascularization: Secondary | ICD-10-CM | POA: Diagnosis not present

## 2018-09-03 DIAGNOSIS — H43813 Vitreous degeneration, bilateral: Secondary | ICD-10-CM

## 2018-09-09 ENCOUNTER — Other Ambulatory Visit: Payer: Self-pay

## 2018-09-09 ENCOUNTER — Telehealth: Payer: Self-pay | Admitting: Family Medicine

## 2018-09-09 NOTE — Telephone Encounter (Signed)
Pt called and cleared = had cough last week = gone now and feels fine - other S/S were discussed and denied as well.

## 2018-09-10 ENCOUNTER — Ambulatory Visit (INDEPENDENT_AMBULATORY_CARE_PROVIDER_SITE_OTHER): Payer: Medicare Other | Admitting: Physician Assistant

## 2018-09-10 ENCOUNTER — Other Ambulatory Visit: Payer: Self-pay

## 2018-09-10 ENCOUNTER — Encounter: Payer: Self-pay | Admitting: Physician Assistant

## 2018-09-10 VITALS — BP 124/76 | HR 66 | Temp 98.2°F | Ht 67.0 in | Wt 174.6 lb

## 2018-09-10 DIAGNOSIS — M810 Age-related osteoporosis without current pathological fracture: Secondary | ICD-10-CM | POA: Diagnosis not present

## 2018-09-10 DIAGNOSIS — G250 Essential tremor: Secondary | ICD-10-CM | POA: Diagnosis not present

## 2018-09-10 DIAGNOSIS — F339 Major depressive disorder, recurrent, unspecified: Secondary | ICD-10-CM

## 2018-09-10 DIAGNOSIS — F411 Generalized anxiety disorder: Secondary | ICD-10-CM

## 2018-09-10 DIAGNOSIS — E785 Hyperlipidemia, unspecified: Secondary | ICD-10-CM | POA: Diagnosis not present

## 2018-09-10 DIAGNOSIS — G4700 Insomnia, unspecified: Secondary | ICD-10-CM

## 2018-09-10 DIAGNOSIS — Z0000 Encounter for general adult medical examination without abnormal findings: Secondary | ICD-10-CM

## 2018-09-10 MED ORDER — PROPRANOLOL HCL ER 80 MG PO CP24: 80.0000 mg | ORAL_CAPSULE | Freq: Every day | ORAL | 3 refills | Status: DC

## 2018-09-10 NOTE — Progress Notes (Signed)
BP 124/76   Pulse 66   Temp 98.2 F (36.8 C) (Oral)   Ht '5\' 7"'  (1.702 m)   Wt 174 lb 9.6 oz (79.2 kg)   BMI 27.35 kg/m    Subjective:    Patient ID: Rachael Bryant, female    DOB: October 02, 1950, 68 y.o.   MRN: [id number]  HPI: Rachael Bryant is a 68 y.o. female presenting on 09/10/2018 for New Patient (Initial Visit) and Establish Care    Past Medical History:  Diagnosis Date  . Anxiety   . High cholesterol    Relevant past medical, surgical, family and social history reviewed and updated as indicated. Interim medical history since our last visit reviewed. Allergies and medications reviewed and updated. DATA REVIEWED: CHART IN EPIC  Family History reviewed for pertinent findings.  Review of Systems  Constitutional: Negative.   HENT: Negative.   Eyes: Negative.   Respiratory: Positive for shortness of breath. Negative for wheezing.   Cardiovascular: Negative for chest pain, palpitations and leg swelling.  Gastrointestinal: Negative.   Genitourinary: Negative.   Neurological: Positive for tremors.  Psychiatric/Behavioral: Positive for dysphoric mood. The patient is nervous/anxious.     Allergies as of 09/10/2018      Reactions   Acetaminophen Hives   Ibuprofen Hives   Other Hives   Codeine Nausea Only      Medication List       Accurate as of September 10, 2018 10:12 AM. If you have any questions, ask your nurse or doctor.        alendronate 70 MG tablet Commonly known as: FOSAMAX Take by mouth.   calcium citrate-vitamin D 315-200 MG-UNIT tablet Commonly known as: CITRACAL+D Take 1 tablet by mouth 2 (two) times daily.   citalopram 10 MG tablet Commonly known as: CELEXA TAKE 1 TABLET BY MOUTH EVERY DAY What changed: Another medication with the same name was removed. Continue taking this medication, and follow the directions you see here. Changed by: Terald Sleeper, PA-C   cyanocobalamin 1000 MCG tablet Take 1,000 mcg by mouth daily.    diphenhydrAMINE 25 mg capsule Commonly known as: BENADRYL Take 50 mg by mouth at bedtime. What changed: Another medication with the same name was removed. Continue taking this medication, and follow the directions you see here. Changed by: Terald Sleeper, PA-C   Fish Oil 1200 MG Caps Take by mouth.   propranolol 80 MG tablet Commonly known as: INDERAL TAKE ONE TABLET (80 MG DOSE) BY MOUTH DAILY.   propranolol ER 80 MG 24 hr capsule Commonly known as: INDERAL LA Take 1 capsule (80 mg total) by mouth daily.   simvastatin 20 MG tablet Commonly known as: ZOCOR Take 20 mg by mouth daily.   trimethoprim-polymyxin b ophthalmic solution Commonly known as: POLYTRIM USE 1 DROP IN RIGHT EYE 4 TIMES DAILY FOR 2 DAYS AFTER EACH MONTHLY EYE INJECTION          Objective:    BP 124/76   Pulse 66   Temp 98.2 F (36.8 C) (Oral)   Ht '5\' 7"'  (1.702 m)   Wt 174 lb 9.6 oz (79.2 kg)   BMI 27.35 kg/m   Allergies  Allergen Reactions  . Acetaminophen Hives  . Ibuprofen Hives  . Other Hives  . Codeine Nausea Only    Wt Readings from Last 3 Encounters:  09/10/18 174 lb 9.6 oz (79.2 kg)  01/09/15 157 lb 3.2 oz (71.3 kg)  09/04/14 152 lb (68.9 kg)  Physical Exam Constitutional:      Appearance: She is well-developed.  HENT:     Head: Normocephalic and atraumatic.  Eyes:     Conjunctiva/sclera: Conjunctivae normal.     Pupils: Pupils are equal, round, and reactive to light.  Cardiovascular:     Rate and Rhythm: Normal rate and regular rhythm.     Heart sounds: Normal heart sounds.  Pulmonary:     Effort: Pulmonary effort is normal.  Skin:    General: Skin is warm and dry.     Findings: No rash.  Neurological:     Mental Status: She is alert and oriented to person, place, and time.     Deep Tendon Reflexes: Reflexes are normal and symmetric.  Psychiatric:        Behavior: Behavior normal.        Thought Content: Thought content normal.        Judgment: Judgment normal.      Results for orders placed or performed in visit on 09/04/14  TSH  Result Value Ref Range   TSH 0.330 (L) 0.450 - 4.500 uIU/mL      Assessment & Plan:   1. Well adult exam - CBC with Differential/Platelet - CMP14+EGFR - Lipid panel - TSH  2. Essential tremor - propranolol ER (INDERAL LA) 80 MG 24 hr capsule; Take 1 capsule (80 mg total) by mouth daily.  Dispense: 90 capsule; Refill: 3  3. Age-related osteoporosis without current pathological fracture - alendronate (FOSAMAX) 70 MG tablet; Take by mouth.  4. Insomnia, unspecified type  5. Anxiety state  6. Recurrent major depressive disorder, remission status unspecified (HCC) - citalopram (CELEXA) 10 MG tablet; TAKE 1 TABLET BY MOUTH EVERY DAY  7. Anxiety, generalized - citalopram (CELEXA) 10 MG tablet; TAKE 1 TABLET BY MOUTH EVERY DAY  8. Dyslipidemia   Continue all other maintenance medications as listed above.  Follow up plan: Return in about 6 months (around 03/13/2019).  Educational handout given for [place] PA-C [facility] [address]  [place], [place] [address] [phone]   09/10/2018, 10:12 AM

## 2018-09-11 LAB — CMP14+EGFR
ALT: 15 IU/L (ref 0–32)
AST: 19 IU/L (ref 0–40)
Albumin/Globulin Ratio: 1.8 (ref 1.2–2.2)
Albumin: 4.2 g/dL (ref 3.8–4.8)
Alkaline Phosphatase: 81 IU/L (ref 39–117)
BUN/Creatinine Ratio: 20 (ref 12–28)
BUN: 15 mg/dL (ref 8–27)
Bilirubin Total: 0.3 mg/dL (ref 0.0–1.2)
CO2: 24 mmol/L (ref 20–29)
Calcium: 9.1 mg/dL (ref 8.7–10.3)
Chloride: 101 mmol/L (ref 96–106)
Creatinine, Ser: 0.76 mg/dL (ref 0.57–1.00)
GFR calc Af Amer: 95 mL/min/{1.73_m2} (ref >59)
GFR calc non Af Amer: 83 mL/min/{1.73_m2} (ref >59)
Globulin, Total: 2.3 g/dL (ref 1.5–4.5)
Glucose: 91 mg/dL (ref 65–99)
Potassium: 4.4 mmol/L (ref 3.5–5.2)
Sodium: 139 mmol/L (ref 134–144)
Total Protein: 6.5 g/dL (ref 6.0–8.5)

## 2018-09-11 LAB — CBC WITH DIFFERENTIAL/PLATELET
Basophils Absolute: 0 10*3/uL (ref 0.0–0.2)
Basos: 1 %
EOS (ABSOLUTE): 0.1 10*3/uL (ref 0.0–0.4)
Eos: 2 %
Hematocrit: 40.3 % (ref 34.0–46.6)
Hemoglobin: 13.1 g/dL (ref 11.1–15.9)
Immature Grans (Abs): 0 10*3/uL (ref 0.0–0.1)
Immature Granulocytes: 0 %
Lymphocytes Absolute: 2 10*3/uL (ref 0.7–3.1)
Lymphs: 33 %
MCH: 29.6 pg (ref 26.6–33.0)
MCHC: 32.5 g/dL (ref 31.5–35.7)
MCV: 91 fL (ref 79–97)
Monocytes Absolute: 0.5 10*3/uL (ref 0.1–0.9)
Monocytes: 8 %
Neutrophils Absolute: 3.5 10*3/uL (ref 1.4–7.0)
Neutrophils: 56 %
Platelets: 267 10*3/uL (ref 150–450)
RBC: 4.43 x10E6/uL (ref 3.77–5.28)
RDW: 13.3 % (ref 11.7–15.4)
WBC: 6.2 10*3/uL (ref 3.4–10.8)

## 2018-09-11 LAB — LIPID PANEL
Chol/HDL Ratio: 3.8 ratio (ref 0.0–4.4)
Cholesterol, Total: 165 mg/dL (ref 100–199)
HDL: 44 mg/dL (ref >39)
LDL Calculated: 92 mg/dL (ref 0–99)
Triglycerides: 144 mg/dL (ref 0–149)
VLDL Cholesterol Cal: 29 mg/dL (ref 5–40)

## 2018-09-11 LAB — TSH: TSH: 0.519 u[IU]/mL (ref 0.450–4.500)

## 2018-10-01 ENCOUNTER — Encounter (INDEPENDENT_AMBULATORY_CARE_PROVIDER_SITE_OTHER): Payer: Medicare Other | Admitting: Ophthalmology

## 2018-10-01 ENCOUNTER — Other Ambulatory Visit: Payer: Self-pay

## 2018-10-01 DIAGNOSIS — H353211 Exudative age-related macular degeneration, right eye, with active choroidal neovascularization: Secondary | ICD-10-CM | POA: Diagnosis not present

## 2018-10-01 DIAGNOSIS — H43813 Vitreous degeneration, bilateral: Secondary | ICD-10-CM

## 2018-10-01 DIAGNOSIS — H353121 Nonexudative age-related macular degeneration, left eye, early dry stage: Secondary | ICD-10-CM | POA: Diagnosis not present

## 2018-10-29 ENCOUNTER — Encounter (INDEPENDENT_AMBULATORY_CARE_PROVIDER_SITE_OTHER): Payer: Medicare Other | Admitting: Ophthalmology

## 2018-10-29 ENCOUNTER — Other Ambulatory Visit: Payer: Self-pay

## 2018-10-29 DIAGNOSIS — H353121 Nonexudative age-related macular degeneration, left eye, early dry stage: Secondary | ICD-10-CM | POA: Diagnosis not present

## 2018-10-29 DIAGNOSIS — H353211 Exudative age-related macular degeneration, right eye, with active choroidal neovascularization: Secondary | ICD-10-CM | POA: Diagnosis not present

## 2018-10-29 DIAGNOSIS — H43813 Vitreous degeneration, bilateral: Secondary | ICD-10-CM | POA: Diagnosis not present

## 2018-11-26 ENCOUNTER — Encounter (INDEPENDENT_AMBULATORY_CARE_PROVIDER_SITE_OTHER): Payer: Medicare Other | Admitting: Ophthalmology

## 2018-11-26 DIAGNOSIS — H353121 Nonexudative age-related macular degeneration, left eye, early dry stage: Secondary | ICD-10-CM | POA: Diagnosis not present

## 2018-11-26 DIAGNOSIS — H353211 Exudative age-related macular degeneration, right eye, with active choroidal neovascularization: Secondary | ICD-10-CM

## 2018-11-26 DIAGNOSIS — H43813 Vitreous degeneration, bilateral: Secondary | ICD-10-CM

## 2018-12-06 DIAGNOSIS — L820 Inflamed seborrheic keratosis: Secondary | ICD-10-CM | POA: Diagnosis not present

## 2018-12-06 DIAGNOSIS — L821 Other seborrheic keratosis: Secondary | ICD-10-CM | POA: Diagnosis not present

## 2018-12-06 DIAGNOSIS — D485 Neoplasm of uncertain behavior of skin: Secondary | ICD-10-CM | POA: Diagnosis not present

## 2018-12-06 DIAGNOSIS — D229 Melanocytic nevi, unspecified: Secondary | ICD-10-CM | POA: Diagnosis not present

## 2018-12-23 ENCOUNTER — Other Ambulatory Visit: Payer: Self-pay | Admitting: Family Medicine

## 2018-12-23 ENCOUNTER — Telehealth: Payer: Self-pay | Admitting: Physician Assistant

## 2018-12-23 DIAGNOSIS — F411 Generalized anxiety disorder: Secondary | ICD-10-CM

## 2018-12-23 DIAGNOSIS — F339 Major depressive disorder, recurrent, unspecified: Secondary | ICD-10-CM

## 2018-12-23 MED ORDER — CITALOPRAM HYDROBROMIDE 10 MG PO TABS: 10.0000 mg | ORAL_TABLET | Freq: Every day | ORAL | 2 refills | Status: DC

## 2018-12-23 NOTE — Telephone Encounter (Signed)
What is the name of the medication? Citalopram 10 mg- Patient states that it shows she has picked up but hadn't heard anything  Have you contacted your pharmacy to request a refill? YES  Which pharmacy would you like this sent to? [facility] in [place]   Patient notified that their request is being sent to the clinical staff for review and that they should receive a call once it is complete. If they do not receive a call within 24 hours they can check with their pharmacy or our office.

## 2018-12-23 NOTE — Telephone Encounter (Signed)
I sent in the requested prescription 

## 2018-12-23 NOTE — Telephone Encounter (Signed)
Message aware that rx sent to pharmacy.

## 2018-12-24 ENCOUNTER — Encounter (INDEPENDENT_AMBULATORY_CARE_PROVIDER_SITE_OTHER): Payer: Medicare Other | Admitting: Ophthalmology

## 2018-12-24 DIAGNOSIS — H43813 Vitreous degeneration, bilateral: Secondary | ICD-10-CM

## 2018-12-24 DIAGNOSIS — H353121 Nonexudative age-related macular degeneration, left eye, early dry stage: Secondary | ICD-10-CM

## 2018-12-24 DIAGNOSIS — H353211 Exudative age-related macular degeneration, right eye, with active choroidal neovascularization: Secondary | ICD-10-CM | POA: Diagnosis not present

## 2018-12-25 ENCOUNTER — Other Ambulatory Visit: Payer: Self-pay | Admitting: Physician Assistant

## 2019-01-16 ENCOUNTER — Other Ambulatory Visit: Payer: Self-pay | Admitting: Family Medicine

## 2019-01-16 DIAGNOSIS — F339 Major depressive disorder, recurrent, unspecified: Secondary | ICD-10-CM

## 2019-01-16 DIAGNOSIS — F411 Generalized anxiety disorder: Secondary | ICD-10-CM

## 2019-01-21 ENCOUNTER — Other Ambulatory Visit: Payer: Self-pay

## 2019-01-21 ENCOUNTER — Encounter (INDEPENDENT_AMBULATORY_CARE_PROVIDER_SITE_OTHER): Payer: Medicare Other | Admitting: Ophthalmology

## 2019-01-21 DIAGNOSIS — H353121 Nonexudative age-related macular degeneration, left eye, early dry stage: Secondary | ICD-10-CM | POA: Diagnosis not present

## 2019-01-21 DIAGNOSIS — H2513 Age-related nuclear cataract, bilateral: Secondary | ICD-10-CM | POA: Diagnosis not present

## 2019-01-21 DIAGNOSIS — H353211 Exudative age-related macular degeneration, right eye, with active choroidal neovascularization: Secondary | ICD-10-CM

## 2019-01-21 DIAGNOSIS — H43813 Vitreous degeneration, bilateral: Secondary | ICD-10-CM | POA: Diagnosis not present

## 2019-02-18 ENCOUNTER — Encounter (INDEPENDENT_AMBULATORY_CARE_PROVIDER_SITE_OTHER): Payer: Medicare Other | Admitting: Ophthalmology

## 2019-02-18 ENCOUNTER — Other Ambulatory Visit: Payer: Self-pay

## 2019-02-18 DIAGNOSIS — H353121 Nonexudative age-related macular degeneration, left eye, early dry stage: Secondary | ICD-10-CM

## 2019-02-18 DIAGNOSIS — H43813 Vitreous degeneration, bilateral: Secondary | ICD-10-CM | POA: Diagnosis not present

## 2019-02-18 DIAGNOSIS — H353211 Exudative age-related macular degeneration, right eye, with active choroidal neovascularization: Secondary | ICD-10-CM

## 2019-03-11 ENCOUNTER — Other Ambulatory Visit: Payer: Self-pay

## 2019-03-11 ENCOUNTER — Telehealth: Payer: Self-pay | Admitting: Physician Assistant

## 2019-03-14 ENCOUNTER — Encounter: Payer: Self-pay | Admitting: Physician Assistant

## 2019-03-14 ENCOUNTER — Other Ambulatory Visit: Payer: Self-pay

## 2019-03-14 ENCOUNTER — Ambulatory Visit (INDEPENDENT_AMBULATORY_CARE_PROVIDER_SITE_OTHER): Payer: Medicare Other | Admitting: Physician Assistant

## 2019-03-14 VITALS — BP 155/84 | HR 71 | Temp 98.7°F | Ht 67.0 in | Wt 177.0 lb

## 2019-03-14 DIAGNOSIS — E785 Hyperlipidemia, unspecified: Secondary | ICD-10-CM | POA: Diagnosis not present

## 2019-03-14 DIAGNOSIS — F339 Major depressive disorder, recurrent, unspecified: Secondary | ICD-10-CM

## 2019-03-14 DIAGNOSIS — H3321 Serous retinal detachment, right eye: Secondary | ICD-10-CM | POA: Diagnosis not present

## 2019-03-14 DIAGNOSIS — G250 Essential tremor: Secondary | ICD-10-CM

## 2019-03-14 DIAGNOSIS — F411 Generalized anxiety disorder: Secondary | ICD-10-CM

## 2019-03-14 MED ORDER — CITALOPRAM HYDROBROMIDE 10 MG PO TABS: 10.0000 mg | ORAL_TABLET | Freq: Every day | ORAL | 1 refills | Status: DC

## 2019-03-14 MED ORDER — SIMVASTATIN 20 MG PO TABS: ORAL_TABLET | ORAL | 3 refills | Status: DC

## 2019-03-14 NOTE — Progress Notes (Signed)
Acute Office Visit  Subjective:    Patient ID: Rachael Bryant, female    DOB: 31-May-1950, 69 y.o.   MRN: [id number]  Chief Complaint  Patient presents with  . Medical Management of Chronic Issues    6 month     Hyperlipidemia This is a chronic problem. The problem is controlled. Pertinent negatives include no chest pain. Current antihyperlipidemic treatment includes statins. The current treatment provides significant improvement of lipids.  Anxiety Presents for follow-up visit. Symptoms include excessive worry and nervous/anxious behavior. Patient reports no chest pain.   Compliance with medications is 76-100%.   Patient is also with a history of benign essential tremor it has been improved with the propranolol change we made last time.  She will continue this medication.  She also will continue with Dr. 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  She gets an injection every month.  It has been able to maintain her vision.  She reports that overall she is doing well.   Meds ordered this encounter  Medications  . citalopram (CELEXA) 10 MG tablet    Sig: Take 1 tablet (10 mg total) by mouth daily.    Dispense:  90 tablet    Refill:  1    Order Specific Question:   Supervising Provider    Answer:   Janora Norlander [id number]  . simvastatin (ZOCOR) 20 MG tablet    Sig: TAKE 1 TABLET BY MOUTH EVERYDAY AT BEDTIME    Dispense:  90 tablet    Refill:  3    Order Specific Question:   Supervising Provider    Answer:   Janora Norlander [id number]    Particia Nearing PA-C [place] [phone] No flowsheet data found.     Past Medical History:  Diagnosis Date  . Anxiety   . High cholesterol     Past Surgical History:  Procedure Laterality Date  . TUBAL LIGATION      Family History  Problem Relation Age of Onset  . Heart attack Father   . Diabetes Mother   . Pulmonary fibrosis Mother   . Parkinsonism Sister     Social  History   Socioeconomic History  . Marital status: Married    Spouse name: Remo Lipps   . Number of children: 2  . Years of education: 47  . Highest education level: Not on file  Occupational History  . Not on file  Tobacco Use  . Smoking status: Never Smoker  . Smokeless tobacco: Never Used  Substance and Sexual Activity  . Alcohol use: No    Alcohol/week: 0.0 standard drinks  . Drug use: No  . Sexual activity: Not on file  Other Topics Concern  . Not on file  Social History Narrative   Lives at home with husband, Remo Lipps.   Caffeine use: Drinks coffee/soda (7 cups per week)   Drinks 2 Liters per week   Social Determinants of Health   Financial Resource Strain:   . Difficulty of Paying Living Expenses: Not on file  Food Insecurity:   . Worried About Charity fundraiser in the Last Year: Not on file  . Ran Out of Food in the Last Year: Not on file  Transportation Needs:   . Lack of Transportation (Medical): Not on file  . Lack of Transportation (Non-Medical): Not on file  Physical Activity:   . Days of Exercise per Week: Not on file  . Minutes of Exercise per Session: Not on  file  Stress:   . Feeling of Stress : Not on file  Social Connections:   . Frequency of Communication with Friends and Family: Not on file  . Frequency of Social Gatherings with Friends and Family: Not on file  . Attends Religious Services: Not on file  . Active Member of Clubs or Organizations: Not on file  . Attends Archivist Meetings: Not on file  . Marital Status: Not on file  Intimate Partner Violence:   . Fear of Current or Ex-Partner: Not on file  . Emotionally Abused: Not on file  . Physically Abused: Not on file  . Sexually Abused: Not on file    Outpatient Medications Prior to Visit  Medication Sig Dispense Refill  . alendronate (FOSAMAX) 70 MG tablet Take by mouth.    . calcium citrate-vitamin D (CITRACAL+D) 315-200 MG-UNIT tablet Take 1 tablet by mouth 2 (two) times  daily.    . cyanocobalamin 1000 MCG tablet Take 1,000 mcg by mouth daily.    . diphenhydrAMINE (BENADRYL) 25 mg capsule Take 50 mg by mouth at bedtime.    . Omega-3 Fatty Acids (FISH OIL) 1200 MG CAPS Take by mouth.    . propranolol ER (INDERAL LA) 80 MG 24 hr capsule Take 1 capsule (80 mg total) by mouth daily. 90 capsule 3  . trimethoprim-polymyxin b (POLYTRIM) ophthalmic solution USE 1 DROP IN RIGHT EYE 4 TIMES DAILY FOR 2 DAYS AFTER EACH MONTHLY EYE INJECTION    . citalopram (CELEXA) 10 MG tablet TAKE 1 TABLET BY MOUTH EVERY DAY 90 tablet 0  . propranolol (INDERAL) 80 MG tablet TAKE ONE TABLET (80 MG DOSE) BY MOUTH DAILY. 90 tablet 1  . simvastatin (ZOCOR) 20 MG tablet TAKE 1 TABLET BY MOUTH EVERYDAY AT BEDTIME 90 tablet 1   No facility-administered medications prior to visit.    Allergies  Allergen Reactions  . Acetaminophen Hives  . Ibuprofen Hives  . Other Hives    NSAIDS  . Codeine Nausea Only    Review of Systems  Constitutional: Negative.  Negative for activity change, fatigue and fever.  HENT: Negative.   Eyes: Negative.   Respiratory: Negative.  Negative for cough.   Cardiovascular: Negative.  Negative for chest pain.  Gastrointestinal: Negative.  Negative for abdominal pain.  Endocrine: Negative.   Genitourinary: Negative.  Negative for dysuria.  Musculoskeletal: Negative.   Skin: Negative.   Neurological: Positive for tremors.  Psychiatric/Behavioral: The patient is nervous/anxious.        Objective:    Physical Exam Constitutional:      General: She is not in acute distress.    Appearance: Normal appearance. She is well-developed.  HENT:     Head: Normocephalic and atraumatic.  Cardiovascular:     Rate and Rhythm: Normal rate.  Pulmonary:     Effort: Pulmonary effort is normal.  Skin:    General: Skin is warm and dry.     Findings: No rash.  Neurological:     Mental Status: She is alert and oriented to person, place, and time.     Deep Tendon  Reflexes: Reflexes are normal and symmetric.     BP (!) 155/84   Pulse 71   Temp 98.7 F (37.1 C) (Temporal)   Ht 5\' 7"  (1.702 m)   Wt 177 lb (80.3 kg)   SpO2 95%   BMI 27.72 kg/m  Wt Readings from Last 3 Encounters:  03/14/19 177 lb (80.3 kg)  09/10/18 174 lb 9.6 oz (  79.2 kg)  01/09/15 157 lb 3.2 oz (71.3 kg)    Health Maintenance Due  Topic Date Due  . Hepatitis C Screening  [birth year]  . MAMMOGRAM  10/05/2002  . COLONOSCOPY  10/05/2002  . DEXA SCAN  10/04/2017  . INFLUENZA VACCINE  09/11/2018  . PNA vac Low Risk Adult (2 of 2 - PPSV23) 01/12/2019    There are no preventive care reminders to display for this patient.   Lab Results  Component Value Date   TSH 0.519 09/10/2018   Lab Results  Component Value Date   WBC 6.2 09/10/2018   HGB 13.1 09/10/2018   HCT 40.3 09/10/2018   MCV 91 09/10/2018   PLT 267 09/10/2018   Lab Results  Component Value Date   NA 139 09/10/2018   K 4.4 09/10/2018   CO2 24 09/10/2018   GLUCOSE 91 09/10/2018   BUN 15 09/10/2018   CREATININE 0.76 09/10/2018   BILITOT 0.3 09/10/2018   ALKPHOS 81 09/10/2018   AST 19 09/10/2018   ALT 15 09/10/2018   PROT 6.5 09/10/2018   ALBUMIN 4.2 09/10/2018   CALCIUM 9.1 09/10/2018   Lab Results  Component Value Date   CHOL 165 09/10/2018   Lab Results  Component Value Date   HDL 44 09/10/2018   Lab Results  Component Value Date   LDLCALC 92 09/10/2018   Lab Results  Component Value Date   TRIG 144 09/10/2018   Lab Results  Component Value Date   CHOLHDL 3.8 09/10/2018   No results found for: HGBA1C     Assessment & Plan:   Problem List Items Addressed This Visit      Other   Depression   Relevant Medications   citalopram (CELEXA) 10 MG tablet   Anxiety, generalized   Relevant Medications   citalopram (CELEXA) 10 MG tablet       Meds ordered this encounter  Medications  . citalopram (CELEXA) 10 MG tablet    Sig: Take 1 tablet (10 mg total) by mouth daily.     Dispense:  90 tablet    Refill:  1    Order Specific Question:   Supervising Provider    Answer:   Janora Norlander [id number]  . simvastatin (ZOCOR) 20 MG tablet    Sig: TAKE 1 TABLET BY MOUTH EVERYDAY AT BEDTIME    Dispense:  90 tablet    Refill:  3    Order Specific Question:   Supervising Provider    Answer:   Janora Norlander [id number]     Terald Sleeper, PA-C

## 2019-03-18 ENCOUNTER — Encounter (INDEPENDENT_AMBULATORY_CARE_PROVIDER_SITE_OTHER): Payer: Medicare Other | Admitting: Ophthalmology

## 2019-03-18 DIAGNOSIS — H43813 Vitreous degeneration, bilateral: Secondary | ICD-10-CM | POA: Diagnosis not present

## 2019-03-18 DIAGNOSIS — H353121 Nonexudative age-related macular degeneration, left eye, early dry stage: Secondary | ICD-10-CM | POA: Diagnosis not present

## 2019-03-18 DIAGNOSIS — H353211 Exudative age-related macular degeneration, right eye, with active choroidal neovascularization: Secondary | ICD-10-CM

## 2019-04-15 ENCOUNTER — Encounter (INDEPENDENT_AMBULATORY_CARE_PROVIDER_SITE_OTHER): Payer: Medicare Other | Admitting: Ophthalmology

## 2019-04-15 ENCOUNTER — Other Ambulatory Visit: Payer: Self-pay

## 2019-04-15 DIAGNOSIS — H353211 Exudative age-related macular degeneration, right eye, with active choroidal neovascularization: Secondary | ICD-10-CM | POA: Diagnosis not present

## 2019-04-15 DIAGNOSIS — H43813 Vitreous degeneration, bilateral: Secondary | ICD-10-CM | POA: Diagnosis not present

## 2019-04-15 DIAGNOSIS — H353121 Nonexudative age-related macular degeneration, left eye, early dry stage: Secondary | ICD-10-CM

## 2019-05-20 ENCOUNTER — Other Ambulatory Visit: Payer: Self-pay

## 2019-05-20 ENCOUNTER — Encounter (INDEPENDENT_AMBULATORY_CARE_PROVIDER_SITE_OTHER): Payer: Medicare Other | Admitting: Ophthalmology

## 2019-05-20 DIAGNOSIS — H2513 Age-related nuclear cataract, bilateral: Secondary | ICD-10-CM

## 2019-05-20 DIAGNOSIS — H353211 Exudative age-related macular degeneration, right eye, with active choroidal neovascularization: Secondary | ICD-10-CM | POA: Diagnosis not present

## 2019-05-20 DIAGNOSIS — H353121 Nonexudative age-related macular degeneration, left eye, early dry stage: Secondary | ICD-10-CM

## 2019-05-20 DIAGNOSIS — H43813 Vitreous degeneration, bilateral: Secondary | ICD-10-CM

## 2019-06-17 ENCOUNTER — Encounter (INDEPENDENT_AMBULATORY_CARE_PROVIDER_SITE_OTHER): Payer: Medicare Other | Admitting: Ophthalmology

## 2019-06-17 DIAGNOSIS — H43813 Vitreous degeneration, bilateral: Secondary | ICD-10-CM | POA: Diagnosis not present

## 2019-06-17 DIAGNOSIS — H353211 Exudative age-related macular degeneration, right eye, with active choroidal neovascularization: Secondary | ICD-10-CM

## 2019-06-17 DIAGNOSIS — H353121 Nonexudative age-related macular degeneration, left eye, early dry stage: Secondary | ICD-10-CM | POA: Diagnosis not present

## 2019-07-14 ENCOUNTER — Encounter (INDEPENDENT_AMBULATORY_CARE_PROVIDER_SITE_OTHER): Payer: Medicare Other | Admitting: Ophthalmology

## 2019-07-14 ENCOUNTER — Other Ambulatory Visit: Payer: Self-pay

## 2019-07-14 DIAGNOSIS — H353121 Nonexudative age-related macular degeneration, left eye, early dry stage: Secondary | ICD-10-CM

## 2019-07-14 DIAGNOSIS — H2513 Age-related nuclear cataract, bilateral: Secondary | ICD-10-CM | POA: Diagnosis not present

## 2019-07-14 DIAGNOSIS — H353211 Exudative age-related macular degeneration, right eye, with active choroidal neovascularization: Secondary | ICD-10-CM

## 2019-07-14 DIAGNOSIS — H43813 Vitreous degeneration, bilateral: Secondary | ICD-10-CM | POA: Diagnosis not present

## 2019-08-11 ENCOUNTER — Other Ambulatory Visit: Payer: Self-pay

## 2019-08-11 ENCOUNTER — Other Ambulatory Visit: Payer: Self-pay | Admitting: *Deleted

## 2019-08-11 ENCOUNTER — Encounter (INDEPENDENT_AMBULATORY_CARE_PROVIDER_SITE_OTHER): Payer: Medicare Other | Admitting: Ophthalmology

## 2019-08-11 DIAGNOSIS — F339 Major depressive disorder, recurrent, unspecified: Secondary | ICD-10-CM

## 2019-08-11 DIAGNOSIS — F411 Generalized anxiety disorder: Secondary | ICD-10-CM

## 2019-08-11 DIAGNOSIS — H353121 Nonexudative age-related macular degeneration, left eye, early dry stage: Secondary | ICD-10-CM | POA: Diagnosis not present

## 2019-08-11 DIAGNOSIS — H353211 Exudative age-related macular degeneration, right eye, with active choroidal neovascularization: Secondary | ICD-10-CM | POA: Diagnosis not present

## 2019-08-11 DIAGNOSIS — H43813 Vitreous degeneration, bilateral: Secondary | ICD-10-CM

## 2019-08-11 MED ORDER — CITALOPRAM HYDROBROMIDE 10 MG PO TABS: 10.0000 mg | ORAL_TABLET | Freq: Every day | ORAL | 0 refills | Status: DC

## 2019-09-08 ENCOUNTER — Other Ambulatory Visit: Payer: Self-pay

## 2019-09-08 ENCOUNTER — Encounter (INDEPENDENT_AMBULATORY_CARE_PROVIDER_SITE_OTHER): Payer: Medicare Other | Admitting: Ophthalmology

## 2019-09-08 DIAGNOSIS — H353211 Exudative age-related macular degeneration, right eye, with active choroidal neovascularization: Secondary | ICD-10-CM

## 2019-09-08 DIAGNOSIS — H43813 Vitreous degeneration, bilateral: Secondary | ICD-10-CM

## 2019-09-08 DIAGNOSIS — H2513 Age-related nuclear cataract, bilateral: Secondary | ICD-10-CM

## 2019-09-08 DIAGNOSIS — H353121 Nonexudative age-related macular degeneration, left eye, early dry stage: Secondary | ICD-10-CM

## 2019-10-06 ENCOUNTER — Encounter (INDEPENDENT_AMBULATORY_CARE_PROVIDER_SITE_OTHER): Payer: Medicare Other | Admitting: Ophthalmology

## 2019-10-06 ENCOUNTER — Other Ambulatory Visit: Payer: Self-pay

## 2019-10-06 DIAGNOSIS — H353211 Exudative age-related macular degeneration, right eye, with active choroidal neovascularization: Secondary | ICD-10-CM

## 2019-10-06 DIAGNOSIS — H353121 Nonexudative age-related macular degeneration, left eye, early dry stage: Secondary | ICD-10-CM | POA: Diagnosis not present

## 2019-10-06 DIAGNOSIS — H43813 Vitreous degeneration, bilateral: Secondary | ICD-10-CM | POA: Diagnosis not present

## 2019-10-10 ENCOUNTER — Other Ambulatory Visit: Payer: Self-pay

## 2019-10-10 DIAGNOSIS — G250 Essential tremor: Secondary | ICD-10-CM

## 2019-10-16 ENCOUNTER — Other Ambulatory Visit: Payer: Self-pay | Admitting: Family Medicine

## 2019-10-16 DIAGNOSIS — G250 Essential tremor: Secondary | ICD-10-CM

## 2019-11-01 ENCOUNTER — Ambulatory Visit (INDEPENDENT_AMBULATORY_CARE_PROVIDER_SITE_OTHER): Payer: Medicare Other

## 2019-11-01 ENCOUNTER — Encounter: Payer: Self-pay | Admitting: Family Medicine

## 2019-11-01 ENCOUNTER — Other Ambulatory Visit: Payer: Self-pay

## 2019-11-01 ENCOUNTER — Ambulatory Visit (INDEPENDENT_AMBULATORY_CARE_PROVIDER_SITE_OTHER): Payer: Medicare Other | Admitting: Family Medicine

## 2019-11-01 VITALS — BP 134/88 | HR 80 | Temp 97.9°F | Ht 67.0 in | Wt 176.8 lb

## 2019-11-01 DIAGNOSIS — G250 Essential tremor: Secondary | ICD-10-CM

## 2019-11-01 DIAGNOSIS — Z780 Asymptomatic menopausal state: Secondary | ICD-10-CM | POA: Diagnosis not present

## 2019-11-01 DIAGNOSIS — M810 Age-related osteoporosis without current pathological fracture: Secondary | ICD-10-CM | POA: Diagnosis not present

## 2019-11-01 DIAGNOSIS — R062 Wheezing: Secondary | ICD-10-CM | POA: Diagnosis not present

## 2019-11-01 DIAGNOSIS — F411 Generalized anxiety disorder: Secondary | ICD-10-CM | POA: Diagnosis not present

## 2019-11-01 DIAGNOSIS — R0600 Dyspnea, unspecified: Secondary | ICD-10-CM

## 2019-11-01 DIAGNOSIS — R0609 Other forms of dyspnea: Secondary | ICD-10-CM

## 2019-11-01 DIAGNOSIS — F339 Major depressive disorder, recurrent, unspecified: Secondary | ICD-10-CM

## 2019-11-01 DIAGNOSIS — R05 Cough: Secondary | ICD-10-CM | POA: Diagnosis not present

## 2019-11-01 DIAGNOSIS — E785 Hyperlipidemia, unspecified: Secondary | ICD-10-CM | POA: Diagnosis not present

## 2019-11-01 DIAGNOSIS — E559 Vitamin D deficiency, unspecified: Secondary | ICD-10-CM | POA: Diagnosis not present

## 2019-11-01 MED ORDER — ALENDRONATE SODIUM 70 MG PO TABS: 70.0000 mg | ORAL_TABLET | ORAL | 3 refills | Status: DC

## 2019-11-01 MED ORDER — CITALOPRAM HYDROBROMIDE 10 MG PO TABS: 10.0000 mg | ORAL_TABLET | Freq: Every day | ORAL | 1 refills | Status: DC

## 2019-11-01 MED ORDER — PROPRANOLOL HCL ER 80 MG PO CP24: 80.0000 mg | ORAL_CAPSULE | Freq: Every day | ORAL | 3 refills | Status: DC

## 2019-11-01 MED ORDER — SIMVASTATIN 20 MG PO TABS
ORAL_TABLET | ORAL | 3 refills | Status: DC
Start: 2019-11-01 — End: 2020-12-24

## 2019-11-01 NOTE — Progress Notes (Signed)
Subjective:  Patient ID: Rachael Bryant, female    DOB: Feb 15, 1950  Age: 69 y.o. MRN: [id number]  CC: Establish Care (AJ patient )   HPI Rachael Bryant presents for establishment as a new patient of mine.  She is a former patient of Particia Nearing who is no longer with the practice.  She is concerned today that she has had some cough and wheezing with chest congestion and dyspnea on exertion ever since her Covid virus infection in March of this year.  She has some benign tremor for which she takes Inderal with good results.  She denies any side effects from that medication.  She does not take Fosamax thinking that the calcium and the vitamin D will be sufficient.  After discussion she says she will resume.  Patient in for follow-up of elevated cholesterol. Doing well without complaints on current medication. Denies side effects of statin including myalgia and arthralgia and nausea. Also in today for liver function testing. Currently no chest pain, shortness of breath or other cardiovascular related symptoms noted.  Citalopram controls her anxiety adequately.  GAD 7 : Generalized Anxiety Score 11/01/2019  Nervous, Anxious, on Edge 0  Control/stop worrying 0  Worry too much - different things 0  Trouble relaxing 0  Restless 0  Easily annoyed or irritable 0  Afraid - awful might happen 0  Total GAD 7 Score 0  Anxiety Difficulty Somewhat difficult      Depression screen [facility] 2/9 11/01/2019 03/14/2019 09/10/2018  Decreased Interest 0 0 0  Down, Depressed, Hopeless 0 0 0  PHQ - 2 Score 0 0 0    History Aneliese has a past medical history of Anxiety and High cholesterol.   She has a past surgical history that includes Tubal ligation.   Her family history includes Diabetes in her mother; Heart attack in her father; Parkinsonism in her sister; Pulmonary fibrosis in her mother.She reports that she has never smoked. She has never used smokeless tobacco. She reports that she does not drink  alcohol and does not use drugs.    ROS Review of Systems  Constitutional: Negative.   HENT: Negative.   Eyes: Negative for visual disturbance.  Respiratory: Negative for shortness of breath.   Cardiovascular: Negative for chest pain.  Gastrointestinal: Negative for abdominal pain.  Musculoskeletal: Negative for arthralgias.  Neurological: Positive for tremors.  Psychiatric/Behavioral: The patient is nervous/anxious.     Objective:  BP 134/88   Pulse 80   Temp 97.9 F (36.6 C) (Temporal)   Ht '5\' 7"'  (1.702 m)   Wt 176 lb 12.8 oz (80.2 kg)   BMI 27.69 kg/m   BP Readings from Last 3 Encounters:  11/01/19 134/88  03/14/19 (!) 155/84  09/10/18 124/76    Wt Readings from Last 3 Encounters:  11/01/19 176 lb 12.8 oz (80.2 kg)  03/14/19 177 lb (80.3 kg)  09/10/18 174 lb 9.6 oz (79.2 kg)     Physical Exam Constitutional:      General: She is not in acute distress.    Appearance: She is well-developed.  HENT:     Head: Normocephalic and atraumatic.  Eyes:     Conjunctiva/sclera: Conjunctivae normal.     Pupils: Pupils are equal, round, and reactive to light.  Neck:     Thyroid: No thyromegaly.  Cardiovascular:     Rate and Rhythm: Normal rate and regular rhythm.     Heart sounds: Normal heart sounds. No murmur heard.   Pulmonary:  Effort: Pulmonary effort is normal. No respiratory distress.     Breath sounds: Normal breath sounds. No wheezing or rales.  Abdominal:     General: Bowel sounds are normal. There is no distension.     Palpations: Abdomen is soft.     Tenderness: There is no abdominal tenderness.  Musculoskeletal:        General: Normal range of motion.     Cervical back: Normal range of motion and neck supple.  Lymphadenopathy:     Cervical: No cervical adenopathy.  Skin:    General: Skin is warm and dry.  Neurological:     Mental Status: She is alert and oriented to person, place, and time.  Psychiatric:        Behavior: Behavior normal.          Thought Content: Thought content normal.        Judgment: Judgment normal.       Assessment & Plan:   Kalese was seen today for establish care.  Diagnoses and all orders for this visit:  Dyspnea on exertion -     CBC with Differential/Platelet -     CMP14+EGFR -     DG Chest 2 View; Future  Dyslipidemia -     CBC with Differential/Platelet -     CMP14+EGFR -     Lipid panel  Anxiety, generalized -     CBC with Differential/Platelet -     CMP14+EGFR -     citalopram (CELEXA) 10 MG tablet; Take 1 tablet (10 mg total) by mouth daily. NEEDS APPOINTMENT FOR FURTHER REFILLS.  Essential tremor -     CBC with Differential/Platelet -     CMP14+EGFR -     propranolol ER (INDERAL LA) 80 MG 24 hr capsule; Take 1 capsule (80 mg total) by mouth daily.  Recurrent major depressive disorder, remission status unspecified (HCC) -     citalopram (CELEXA) 10 MG tablet; Take 1 tablet (10 mg total) by mouth daily. NEEDS APPOINTMENT FOR FURTHER REFILLS.  Age-related osteoporosis without current pathological fracture -     alendronate (FOSAMAX) 70 MG tablet; Take 1 tablet (70 mg total) by mouth once a week. -     DG [facility] DEXA; Future  Post-menopausal -     DG [facility] DEXA; Future  Other orders -     simvastatin (ZOCOR) 20 MG tablet; TAKE 1 TABLET BY MOUTH EVERYDAY AT BEDTIME       I have changed Lunette B. Modica's alendronate. I am also having her maintain her trimethoprim-polymyxin b, diphenhydrAMINE, cyanocobalamin, Fish Oil, calcium citrate-vitamin D, citalopram, propranolol ER, and simvastatin.  Allergies as of 11/01/2019      Reactions   Acetaminophen Hives   Ibuprofen Hives   Other Hives   NSAIDS   Codeine Nausea Only      Medication List       Accurate as of November 01, 2019 10:59 PM. If you have any questions, ask your nurse or doctor.        alendronate 70 MG tablet Commonly known as: FOSAMAX Take 1 tablet (70 mg total) by mouth once a week. What changed:    how much to take  when to take this Changed by: Claretta Fraise, MD   calcium citrate-vitamin D 315-200 MG-UNIT tablet Commonly known as: CITRACAL+D Take 1 tablet by mouth 2 (two) times daily.   citalopram 10 MG tablet Commonly known as: CELEXA Take 1 tablet (10 mg total) by mouth daily. NEEDS APPOINTMENT FOR  FURTHER REFILLS.   cyanocobalamin 1000 MCG tablet Take 1,000 mcg by mouth daily.   diphenhydrAMINE 25 mg capsule Commonly known as: BENADRYL Take 50 mg by mouth at bedtime.   Fish Oil 1200 MG Caps Take by mouth.   propranolol ER 80 MG 24 hr capsule Commonly known as: INDERAL LA Take 1 capsule (80 mg total) by mouth daily.   simvastatin 20 MG tablet Commonly known as: ZOCOR TAKE 1 TABLET BY MOUTH EVERYDAY AT BEDTIME   trimethoprim-polymyxin b ophthalmic solution Commonly known as: POLYTRIM USE 1 DROP IN RIGHT EYE 4 TIMES DAILY FOR 2 DAYS AFTER EACH MONTHLY EYE INJECTION        Follow-up: Return in about 6 months (around 04/30/2020).  Claretta Fraise, M.D.

## 2019-11-02 LAB — CBC WITH DIFFERENTIAL/PLATELET
Basophils Absolute: 0.1 10*3/uL (ref 0.0–0.2)
Basos: 1 %
EOS (ABSOLUTE): 0.1 10*3/uL (ref 0.0–0.4)
Eos: 1 %
Hematocrit: 41.8 % (ref 34.0–46.6)
Hemoglobin: 13.9 g/dL (ref 11.1–15.9)
Immature Grans (Abs): 0 10*3/uL (ref 0.0–0.1)
Immature Granulocytes: 0 %
Lymphocytes Absolute: 2.6 10*3/uL (ref 0.7–3.1)
Lymphs: 35 %
MCH: 29.9 pg (ref 26.6–33.0)
MCHC: 33.3 g/dL (ref 31.5–35.7)
MCV: 90 fL (ref 79–97)
Monocytes Absolute: 0.6 10*3/uL (ref 0.1–0.9)
Monocytes: 8 %
Neutrophils Absolute: 4.1 10*3/uL (ref 1.4–7.0)
Neutrophils: 55 %
Platelets: 269 10*3/uL (ref 150–450)
RBC: 4.65 x10E6/uL (ref 3.77–5.28)
RDW: 13.5 % (ref 11.7–15.4)
WBC: 7.3 10*3/uL (ref 3.4–10.8)

## 2019-11-02 LAB — CMP14+EGFR
ALT: 19 IU/L (ref 0–32)
AST: 18 IU/L (ref 0–40)
Albumin/Globulin Ratio: 1.7 (ref 1.2–2.2)
Albumin: 4.6 g/dL (ref 3.8–4.8)
Alkaline Phosphatase: 100 IU/L (ref 44–121)
BUN/Creatinine Ratio: 17 (ref 12–28)
BUN: 14 mg/dL (ref 8–27)
Bilirubin Total: 0.4 mg/dL (ref 0.0–1.2)
CO2: 23 mmol/L (ref 20–29)
Calcium: 9.6 mg/dL (ref 8.7–10.3)
Chloride: 100 mmol/L (ref 96–106)
Creatinine, Ser: 0.81 mg/dL (ref 0.57–1.00)
GFR calc Af Amer: 87 mL/min/{1.73_m2} (ref >59)
GFR calc non Af Amer: 75 mL/min/{1.73_m2} (ref >59)
Globulin, Total: 2.7 g/dL (ref 1.5–4.5)
Glucose: 87 mg/dL (ref 65–99)
Potassium: 4.4 mmol/L (ref 3.5–5.2)
Sodium: 140 mmol/L (ref 134–144)
Total Protein: 7.3 g/dL (ref 6.0–8.5)

## 2019-11-02 LAB — LIPID PANEL
Chol/HDL Ratio: 4.1 ratio (ref 0.0–4.4)
Cholesterol, Total: 191 mg/dL (ref 100–199)
HDL: 47 mg/dL (ref >39)
LDL Chol Calc (NIH): 123 mg/dL — ABNORMAL HIGH (ref 0–99)
Triglycerides: 115 mg/dL (ref 0–149)
VLDL Cholesterol Cal: 21 mg/dL (ref 5–40)

## 2019-11-02 NOTE — Progress Notes (Signed)
Your chest x-ray looked normal. Thanks, WS.

## 2019-11-03 ENCOUNTER — Encounter (INDEPENDENT_AMBULATORY_CARE_PROVIDER_SITE_OTHER): Payer: Medicare Other | Admitting: Ophthalmology

## 2019-11-03 ENCOUNTER — Telehealth: Payer: Self-pay | Admitting: Family Medicine

## 2019-11-03 ENCOUNTER — Other Ambulatory Visit: Payer: Self-pay

## 2019-11-03 DIAGNOSIS — H353211 Exudative age-related macular degeneration, right eye, with active choroidal neovascularization: Secondary | ICD-10-CM | POA: Diagnosis not present

## 2019-11-03 DIAGNOSIS — H43813 Vitreous degeneration, bilateral: Secondary | ICD-10-CM

## 2019-11-03 DIAGNOSIS — H353121 Nonexudative age-related macular degeneration, left eye, early dry stage: Secondary | ICD-10-CM

## 2019-11-03 DIAGNOSIS — M8580 Other specified disorders of bone density and structure, unspecified site: Secondary | ICD-10-CM

## 2019-11-03 NOTE — Telephone Encounter (Signed)
Pt rc for labs

## 2019-11-03 NOTE — Telephone Encounter (Signed)
Pt aware of results 

## 2019-11-04 ENCOUNTER — Ambulatory Visit (INDEPENDENT_AMBULATORY_CARE_PROVIDER_SITE_OTHER): Payer: Medicare Other

## 2019-11-04 DIAGNOSIS — Z23 Encounter for immunization: Secondary | ICD-10-CM | POA: Diagnosis not present

## 2019-11-05 LAB — VITAMIN D 25 HYDROXY (VIT D DEFICIENCY, FRACTURES): Vit D, 25-Hydroxy: 22.7 ng/mL — ABNORMAL LOW (ref 30.0–100.0)

## 2019-11-05 LAB — SPECIMEN STATUS REPORT

## 2019-11-07 DIAGNOSIS — R6889 Other general symptoms and signs: Secondary | ICD-10-CM | POA: Diagnosis not present

## 2019-11-08 ENCOUNTER — Other Ambulatory Visit: Payer: Self-pay | Admitting: Family Medicine

## 2019-11-08 MED ORDER — VITAMIN D (ERGOCALCIFEROL) 1.25 MG (50000 UNIT) PO CAPS: 50000.0000 [IU] | ORAL_CAPSULE | ORAL | 3 refills | Status: DC

## 2019-11-21 DIAGNOSIS — R6889 Other general symptoms and signs: Secondary | ICD-10-CM | POA: Diagnosis not present

## 2019-11-29 ENCOUNTER — Encounter (INDEPENDENT_AMBULATORY_CARE_PROVIDER_SITE_OTHER): Payer: Medicare Other | Admitting: Ophthalmology

## 2019-11-29 ENCOUNTER — Other Ambulatory Visit: Payer: Self-pay

## 2019-11-29 DIAGNOSIS — H353211 Exudative age-related macular degeneration, right eye, with active choroidal neovascularization: Secondary | ICD-10-CM

## 2019-11-29 DIAGNOSIS — H353121 Nonexudative age-related macular degeneration, left eye, early dry stage: Secondary | ICD-10-CM

## 2019-11-29 DIAGNOSIS — H43813 Vitreous degeneration, bilateral: Secondary | ICD-10-CM | POA: Diagnosis not present

## 2019-12-27 ENCOUNTER — Other Ambulatory Visit: Payer: Self-pay

## 2019-12-27 ENCOUNTER — Encounter (INDEPENDENT_AMBULATORY_CARE_PROVIDER_SITE_OTHER): Payer: Medicare Other | Admitting: Ophthalmology

## 2019-12-27 DIAGNOSIS — H353121 Nonexudative age-related macular degeneration, left eye, early dry stage: Secondary | ICD-10-CM

## 2019-12-27 DIAGNOSIS — H43813 Vitreous degeneration, bilateral: Secondary | ICD-10-CM

## 2019-12-27 DIAGNOSIS — H353211 Exudative age-related macular degeneration, right eye, with active choroidal neovascularization: Secondary | ICD-10-CM

## 2020-01-24 ENCOUNTER — Encounter (INDEPENDENT_AMBULATORY_CARE_PROVIDER_SITE_OTHER): Payer: Medicare Other | Admitting: Ophthalmology

## 2020-01-24 ENCOUNTER — Other Ambulatory Visit: Payer: Self-pay

## 2020-01-24 DIAGNOSIS — H353211 Exudative age-related macular degeneration, right eye, with active choroidal neovascularization: Secondary | ICD-10-CM | POA: Diagnosis not present

## 2020-01-24 DIAGNOSIS — H43813 Vitreous degeneration, bilateral: Secondary | ICD-10-CM | POA: Diagnosis not present

## 2020-01-24 DIAGNOSIS — H353121 Nonexudative age-related macular degeneration, left eye, early dry stage: Secondary | ICD-10-CM | POA: Diagnosis not present

## 2020-02-21 ENCOUNTER — Encounter (INDEPENDENT_AMBULATORY_CARE_PROVIDER_SITE_OTHER): Payer: Medicare Other | Admitting: Ophthalmology

## 2020-02-21 ENCOUNTER — Other Ambulatory Visit: Payer: Self-pay

## 2020-02-21 DIAGNOSIS — H43813 Vitreous degeneration, bilateral: Secondary | ICD-10-CM

## 2020-02-21 DIAGNOSIS — H353211 Exudative age-related macular degeneration, right eye, with active choroidal neovascularization: Secondary | ICD-10-CM | POA: Diagnosis not present

## 2020-02-21 DIAGNOSIS — H353121 Nonexudative age-related macular degeneration, left eye, early dry stage: Secondary | ICD-10-CM

## 2020-02-27 ENCOUNTER — Ambulatory Visit: Payer: Self-pay | Admitting: Dermatology

## 2020-03-20 ENCOUNTER — Encounter (INDEPENDENT_AMBULATORY_CARE_PROVIDER_SITE_OTHER): Payer: Medicare Other | Admitting: Ophthalmology

## 2020-04-03 ENCOUNTER — Other Ambulatory Visit: Payer: Self-pay

## 2020-04-03 ENCOUNTER — Other Ambulatory Visit: Payer: Self-pay | Admitting: Family Medicine

## 2020-04-03 ENCOUNTER — Encounter (INDEPENDENT_AMBULATORY_CARE_PROVIDER_SITE_OTHER): Payer: Medicare Other | Admitting: Ophthalmology

## 2020-04-03 DIAGNOSIS — H353211 Exudative age-related macular degeneration, right eye, with active choroidal neovascularization: Secondary | ICD-10-CM | POA: Diagnosis not present

## 2020-04-03 DIAGNOSIS — H353121 Nonexudative age-related macular degeneration, left eye, early dry stage: Secondary | ICD-10-CM | POA: Diagnosis not present

## 2020-04-03 DIAGNOSIS — H43813 Vitreous degeneration, bilateral: Secondary | ICD-10-CM | POA: Diagnosis not present

## 2020-04-03 DIAGNOSIS — F339 Major depressive disorder, recurrent, unspecified: Secondary | ICD-10-CM

## 2020-04-03 DIAGNOSIS — F411 Generalized anxiety disorder: Secondary | ICD-10-CM

## 2020-04-30 ENCOUNTER — Encounter: Payer: Self-pay | Admitting: Family Medicine

## 2020-04-30 ENCOUNTER — Other Ambulatory Visit: Payer: Self-pay

## 2020-04-30 ENCOUNTER — Ambulatory Visit (INDEPENDENT_AMBULATORY_CARE_PROVIDER_SITE_OTHER): Payer: Medicare Other | Admitting: Family Medicine

## 2020-04-30 VITALS — BP 132/79 | HR 77 | Temp 98.0°F | Ht 67.0 in | Wt 178.2 lb

## 2020-04-30 DIAGNOSIS — F339 Major depressive disorder, recurrent, unspecified: Secondary | ICD-10-CM

## 2020-04-30 DIAGNOSIS — G4700 Insomnia, unspecified: Secondary | ICD-10-CM

## 2020-04-30 DIAGNOSIS — Z1159 Encounter for screening for other viral diseases: Secondary | ICD-10-CM | POA: Diagnosis not present

## 2020-04-30 DIAGNOSIS — E559 Vitamin D deficiency, unspecified: Secondary | ICD-10-CM

## 2020-04-30 DIAGNOSIS — G250 Essential tremor: Secondary | ICD-10-CM | POA: Diagnosis not present

## 2020-04-30 DIAGNOSIS — F411 Generalized anxiety disorder: Secondary | ICD-10-CM

## 2020-04-30 DIAGNOSIS — Z23 Encounter for immunization: Secondary | ICD-10-CM

## 2020-04-30 DIAGNOSIS — E785 Hyperlipidemia, unspecified: Secondary | ICD-10-CM | POA: Diagnosis not present

## 2020-04-30 MED ORDER — CITALOPRAM HYDROBROMIDE 10 MG PO TABS: 10.0000 mg | ORAL_TABLET | Freq: Every day | ORAL | 3 refills | Status: DC

## 2020-04-30 NOTE — Addendum Note (Signed)
Addended by: Christia Reading on: 04/30/2020 08:40 AM   Modules accepted: Orders

## 2020-04-30 NOTE — Progress Notes (Signed)
Subjective:  Patient ID: Rachael Bryant, female    DOB: [birth year]  Age: 70 y.o. MRN: [id number]  CC: Medical Management of Chronic Issues   HPI [name] presents for  in for follow-up of elevated cholesterol. Doing well without complaints on current medication. Denies side effects of statin including myalgia and arthralgia and nausea. Currently no chest pain, shortness of breath or other cardiovascular related symptoms noted.  Using fosamax & Vitamin D weekly. Not much calcium in diet as she doesn't eat much meat or dairy  Depression screen [facility] 2/9 04/30/2020 11/01/2019 03/14/2019  Decreased Interest 0 0 0  Down, Depressed, Hopeless 0 0 0  PHQ - 2 Score 0 0 0    History Rosalie has a past medical history of Anxiety and High cholesterol.   She has a past surgical history that includes Tubal ligation.   Her family history includes Diabetes in her mother; Heart attack in her father; Parkinsonism in her sister; Pulmonary fibrosis in her mother.She reports that she has never smoked. She has never used smokeless tobacco. She reports that she does not drink alcohol and does not use drugs.    ROS Review of Systems  Constitutional: Negative.   HENT: Negative.   Eyes: Negative for visual disturbance.  Respiratory: Negative for shortness of breath.   Cardiovascular: Negative for chest pain.  Gastrointestinal: Negative for abdominal pain.  Musculoskeletal: Negative for arthralgias.    Objective:  BP 132/79   Pulse 77   Temp 98 F (36.7 C)   Ht _0  (1.702 m)   Wt 178 lb 3.2 oz (80.8 kg)   SpO2 97%   BMI 27.91 kg/m   BP Readings from Last 3 Encounters:  04/30/20 132/79  11/01/19 134/88  03/14/19 (!) 155/84    Wt Readings from Last 3 Encounters:  04/30/20 178 lb 3.2 oz (80.8 kg)  11/01/19 176 lb 12.8 oz (80.2 kg)  03/14/19 177 lb (80.3 kg)     Physical Exam Constitutional:      General: She is not in acute distress.    Appearance: She is well-developed.   HENT:     Head: Normocephalic and atraumatic.  Eyes:     Conjunctiva/sclera: Conjunctivae normal.     Pupils: Pupils are equal, round, and reactive to light.  Neck:     Thyroid: No thyromegaly.  Cardiovascular:     Rate and Rhythm: Normal rate and regular rhythm.     Heart sounds: Normal heart sounds. No murmur heard.   Pulmonary:     Effort: Pulmonary effort is normal. No respiratory distress.     Breath sounds: Normal breath sounds. No wheezing or rales.  Abdominal:     General: Bowel sounds are normal. There is no distension.     Palpations: Abdomen is soft.     Tenderness: There is no abdominal tenderness.  Musculoskeletal:        General: Normal range of motion.     Cervical back: Normal range of motion and neck supple.  Lymphadenopathy:     Cervical: No cervical adenopathy.  Skin:    General: Skin is warm and dry.  Neurological:     Mental Status: She is alert and oriented to person, place, and time.  Psychiatric:        Behavior: Behavior normal.        Thought Content: Thought content normal.        Judgment: Judgment normal.       Assessment & Plan:  Darian was seen today for medical management of chronic issues.  Diagnoses and all orders for this visit:  Anxiety, generalized -     CBC with Differential/Platelet -     CMP14+EGFR  Recurrent major depressive disorder, remission status unspecified ([place]) -     CBC with Differential/Platelet -     CMP14+EGFR  Dyslipidemia -     Lipid panel -     CBC with Differential/Platelet -     CMP14+EGFR  Essential tremor -     CBC with Differential/Platelet -     CMP14+EGFR  Insomnia, unspecified type -     CBC with Differential/Platelet -     CMP14+EGFR  Vitamin D deficiency -     CBC with Differential/Platelet -     CMP14+EGFR  Need for hepatitis C screening test -     Hepatitis C antibody -     CBC with Differential/Platelet -     CMP14+EGFR       I am having Fredrich Romans maintain her  trimethoprim-polymyxin b, diphenhydrAMINE, cyanocobalamin, Fish Oil, calcium citrate-vitamin D, propranolol ER, simvastatin, alendronate, Vitamin D (Ergocalciferol), and citalopram.  Allergies as of 04/30/2020      Reactions   Acetaminophen Hives   Ibuprofen Hives   Other Hives   NSAIDS   Codeine Nausea Only      Medication List       Accurate as of April 30, 2020  8:21 AM. If you have any questions, ask your nurse or doctor.        alendronate 70 MG tablet Commonly known as: FOSAMAX Take 1 tablet (70 mg total) by mouth once a week.   calcium citrate-vitamin D 315-200 MG-UNIT tablet Commonly known as: CITRACAL+D Take 1 tablet by mouth 2 (two) times daily.   citalopram 10 MG tablet Commonly known as: CELEXA Take 1 tablet (10 mg total) by mouth daily.   cyanocobalamin 1000 MCG tablet Take 1,000 mcg by mouth daily.   diphenhydrAMINE 25 mg capsule Commonly known as: BENADRYL Take 50 mg by mouth at bedtime.   Fish Oil 1200 MG Caps Take by mouth.   propranolol ER 80 MG 24 hr capsule Commonly known as: INDERAL LA Take 1 capsule (80 mg total) by mouth daily.   simvastatin 20 MG tablet Commonly known as: ZOCOR TAKE 1 TABLET BY MOUTH EVERYDAY AT BEDTIME   trimethoprim-polymyxin b ophthalmic solution Commonly known as: POLYTRIM USE 1 DROP IN RIGHT EYE 4 TIMES DAILY FOR 2 DAYS AFTER EACH MONTHLY EYE INJECTION   Vitamin D (Ergocalciferol) 1.25 MG (50000 UNIT) Caps capsule Commonly known as: DRISDOL Take 1 capsule (50,000 Units total) by mouth every 7 (seven) days.        Follow-up: Return in about 6 months (around 10/31/2020) for [facility] physical.  Claretta Fraise, M.D.

## 2020-05-01 ENCOUNTER — Encounter (INDEPENDENT_AMBULATORY_CARE_PROVIDER_SITE_OTHER): Payer: Medicare Other | Admitting: Ophthalmology

## 2020-05-01 DIAGNOSIS — H43813 Vitreous degeneration, bilateral: Secondary | ICD-10-CM

## 2020-05-01 DIAGNOSIS — H353211 Exudative age-related macular degeneration, right eye, with active choroidal neovascularization: Secondary | ICD-10-CM

## 2020-05-01 DIAGNOSIS — I10 Essential (primary) hypertension: Secondary | ICD-10-CM | POA: Diagnosis not present

## 2020-05-01 DIAGNOSIS — H35033 Hypertensive retinopathy, bilateral: Secondary | ICD-10-CM | POA: Diagnosis not present

## 2020-05-01 DIAGNOSIS — H353121 Nonexudative age-related macular degeneration, left eye, early dry stage: Secondary | ICD-10-CM | POA: Diagnosis not present

## 2020-05-01 LAB — CBC WITH DIFFERENTIAL/PLATELET
Basophils Absolute: 0 10*3/uL (ref 0.0–0.2)
Basos: 1 %
EOS (ABSOLUTE): 0.1 10*3/uL (ref 0.0–0.4)
Eos: 2 %
Hematocrit: 41.3 % (ref 34.0–46.6)
Hemoglobin: 13.6 g/dL (ref 11.1–15.9)
Immature Grans (Abs): 0 10*3/uL (ref 0.0–0.1)
Immature Granulocytes: 0 %
Lymphocytes Absolute: 1.7 10*3/uL (ref 0.7–3.1)
Lymphs: 31 %
MCH: 29.6 pg (ref 26.6–33.0)
MCHC: 32.9 g/dL (ref 31.5–35.7)
MCV: 90 fL (ref 79–97)
Monocytes Absolute: 0.5 10*3/uL (ref 0.1–0.9)
Monocytes: 8 %
Neutrophils Absolute: 3.1 10*3/uL (ref 1.4–7.0)
Neutrophils: 58 %
Platelets: 272 10*3/uL (ref 150–450)
RBC: 4.59 x10E6/uL (ref 3.77–5.28)
RDW: 13.6 % (ref 11.7–15.4)
WBC: 5.5 10*3/uL (ref 3.4–10.8)

## 2020-05-01 LAB — LIPID PANEL
Chol/HDL Ratio: 3.9 ratio (ref 0.0–4.4)
Cholesterol, Total: 181 mg/dL (ref 100–199)
HDL: 47 mg/dL (ref >39)
LDL Chol Calc (NIH): 113 mg/dL — ABNORMAL HIGH (ref 0–99)
Triglycerides: 115 mg/dL (ref 0–149)
VLDL Cholesterol Cal: 21 mg/dL (ref 5–40)

## 2020-05-01 LAB — HEPATITIS C ANTIBODY: Hep C Virus Ab: <0.1 s/co ratio (ref 0.0–0.9)

## 2020-05-01 LAB — CMP14+EGFR
ALT: 19 IU/L (ref 0–32)
AST: 21 IU/L (ref 0–40)
Albumin/Globulin Ratio: 1.7 (ref 1.2–2.2)
Albumin: 4.3 g/dL (ref 3.8–4.8)
Alkaline Phosphatase: 94 IU/L (ref 44–121)
BUN/Creatinine Ratio: 19 (ref 12–28)
BUN: 15 mg/dL (ref 8–27)
Bilirubin Total: 0.4 mg/dL (ref 0.0–1.2)
CO2: 24 mmol/L (ref 20–29)
Calcium: 9.1 mg/dL (ref 8.7–10.3)
Chloride: 101 mmol/L (ref 96–106)
Creatinine, Ser: 0.81 mg/dL (ref 0.57–1.00)
Globulin, Total: 2.5 g/dL (ref 1.5–4.5)
Glucose: 109 mg/dL — ABNORMAL HIGH (ref 65–99)
Potassium: 4.6 mmol/L (ref 3.5–5.2)
Sodium: 139 mmol/L (ref 134–144)
Total Protein: 6.8 g/dL (ref 6.0–8.5)
eGFR: 80 mL/min/{1.73_m2} (ref >59)

## 2020-05-01 NOTE — Progress Notes (Signed)
Hello Cambri,  Your lab result is normal and/or stable.Some minor variations that are not significant are commonly marked abnormal, but do not represent any medical problem for you.  Best regards, Turrell Severt, M.D.

## 2020-05-02 ENCOUNTER — Ambulatory Visit: Payer: Medicare Other | Admitting: Dermatology

## 2020-05-02 ENCOUNTER — Encounter: Payer: Self-pay | Admitting: Dermatology

## 2020-05-02 ENCOUNTER — Other Ambulatory Visit: Payer: Self-pay

## 2020-05-02 DIAGNOSIS — Z1283 Encounter for screening for malignant neoplasm of skin: Secondary | ICD-10-CM | POA: Diagnosis not present

## 2020-05-02 DIAGNOSIS — D485 Neoplasm of uncertain behavior of skin: Secondary | ICD-10-CM

## 2020-05-02 DIAGNOSIS — L821 Other seborrheic keratosis: Secondary | ICD-10-CM

## 2020-05-02 DIAGNOSIS — C44311 Basal cell carcinoma of skin of nose: Secondary | ICD-10-CM | POA: Diagnosis not present

## 2020-05-02 DIAGNOSIS — C4491 Basal cell carcinoma of skin, unspecified: Secondary | ICD-10-CM

## 2020-05-02 HISTORY — DX: Basal cell carcinoma of skin, unspecified: C44.91

## 2020-05-13 ENCOUNTER — Encounter: Payer: Self-pay | Admitting: Dermatology

## 2020-05-13 NOTE — Progress Notes (Signed)
   Follow-Up Visit   Subjective  Rachael Bryant is a 70 y.o. female who presents for the following: Skin Problem (Wants spot remove on left nare- check spot on left cheek and right eye).  Growth left nostril, several areas to check Location:  Duration:  Quality:  Associated Signs/Symptoms: Modifying Factors:  Severity:  Timing: Context:   Objective  Well appearing patient in no apparent distress; mood and affect are within normal limits. Objective  Left Alar Crease: Pearly 6 mm papule strongly suggestive of BCC.  If biopsy positive will refer for Mohs.       Objective  Left Malar Cheek, Right Shoulder - Posterior: Textured tan-brown 6 mm flattopped papules  Objective  Left Upper Back: Sun exposed areas back examined; no atypical pigmented lesions.    All sun exposed areas plus back examined.   Assessment & Plan    Neoplasm of uncertain behavior of skin Left Alar Crease  Skin / nail biopsy Type of biopsy: tangential   Informed consent: discussed and consent obtained   Timeout: patient name, date of birth, surgical site, and procedure verified   Procedure prep:  Patient was prepped and draped in usual sterile fashion (Non sterile) Prep type:  Chlorhexidine Anesthesia: the lesion was anesthetized in a standard fashion   Anesthetic:  1% lidocaine w/ epinephrine 1-100,000 local infiltration Instrument used: flexible razor blade   Outcome: patient tolerated procedure well   Post-procedure details: wound care instructions given    Specimen 1 - Surgical pathology Differential Diagnosis: bcc vs scc  Check Margins: No  Seborrheic keratosis (2) Right Shoulder - Posterior; Left Malar Cheek  Ok to leave if stable.  Encounter for screening for malignant neoplasm of skin Left Upper Back  Annual skin examination.      I, Lavonna Monarch, MD, have reviewed all documentation for this visit.  The documentation on 05/13/20 for the exam, diagnosis,  procedures, and orders are all accurate and complete.

## 2020-05-14 ENCOUNTER — Telehealth: Payer: Self-pay

## 2020-05-14 NOTE — Telephone Encounter (Signed)
Phone call to patient with her pathology results.  Patient doesn't have a voicemail, unable to leave message.

## 2020-05-14 NOTE — Telephone Encounter (Signed)
-----   Message from Lavonna Monarch, MD sent at 05/10/2020  5:14 PM EDT ----- Schedule Mohs

## 2020-05-14 NOTE — Telephone Encounter (Signed)
Patient returned your call. She states that she was on the phone when you called and seen our number pop up.

## 2020-05-14 NOTE — Telephone Encounter (Signed)
Phone call to patient with her pathology results.  Patient aware of results. Information sent to [facility] for [facility]

## 2020-06-05 ENCOUNTER — Encounter (INDEPENDENT_AMBULATORY_CARE_PROVIDER_SITE_OTHER): Payer: Medicare Other | Admitting: Ophthalmology

## 2020-06-05 ENCOUNTER — Other Ambulatory Visit: Payer: Self-pay

## 2020-06-05 DIAGNOSIS — H353211 Exudative age-related macular degeneration, right eye, with active choroidal neovascularization: Secondary | ICD-10-CM

## 2020-06-05 DIAGNOSIS — H43813 Vitreous degeneration, bilateral: Secondary | ICD-10-CM | POA: Diagnosis not present

## 2020-06-05 DIAGNOSIS — H35033 Hypertensive retinopathy, bilateral: Secondary | ICD-10-CM | POA: Diagnosis not present

## 2020-06-05 DIAGNOSIS — H353121 Nonexudative age-related macular degeneration, left eye, early dry stage: Secondary | ICD-10-CM | POA: Diagnosis not present

## 2020-06-05 DIAGNOSIS — I10 Essential (primary) hypertension: Secondary | ICD-10-CM | POA: Diagnosis not present

## 2020-06-20 ENCOUNTER — Ambulatory Visit: Payer: Medicare Other

## 2020-07-02 DIAGNOSIS — N644 Mastodynia: Secondary | ICD-10-CM | POA: Diagnosis not present

## 2020-07-03 ENCOUNTER — Other Ambulatory Visit: Payer: Self-pay

## 2020-07-03 ENCOUNTER — Ambulatory Visit (INDEPENDENT_AMBULATORY_CARE_PROVIDER_SITE_OTHER): Payer: Medicare Other | Admitting: Nurse Practitioner

## 2020-07-03 ENCOUNTER — Encounter: Payer: Self-pay | Admitting: Nurse Practitioner

## 2020-07-03 ENCOUNTER — Ambulatory Visit (INDEPENDENT_AMBULATORY_CARE_PROVIDER_SITE_OTHER): Payer: Medicare Other

## 2020-07-03 VITALS — BP 138/83 | HR 67 | Temp 97.5°F | Ht 67.0 in | Wt 176.0 lb

## 2020-07-03 DIAGNOSIS — M545 Low back pain, unspecified: Secondary | ICD-10-CM | POA: Diagnosis not present

## 2020-07-03 MED ORDER — METHOCARBAMOL 500 MG PO TABS: 500.0000 mg | ORAL_TABLET | Freq: Four times a day (QID) | ORAL | 0 refills | Status: DC

## 2020-07-03 MED ORDER — PREDNISONE 10 MG (21) PO TBPK: ORAL_TABLET | ORAL | 0 refills | Status: DC

## 2020-07-03 MED ORDER — METHYLPREDNISOLONE ACETATE 40 MG/ML IJ SUSP
80.0000 mg | Freq: Once | INTRAMUSCULAR | Status: AC
  Administered 2020-07-03: 80 mg via INTRAMUSCULAR

## 2020-07-03 NOTE — Patient Instructions (Signed)
acute Back Pain, Adult Acute back pain is sudden and usually short-lived. It is often caused by an injury to the muscles and tissues in the back. The injury may result from:  A muscle or ligament getting overstretched or torn (strained). Ligaments are tissues that connect bones to each other. Lifting something improperly can cause a back strain.  Wear and tear (degeneration) of the spinal disks. Spinal disks are circular tissue that provide cushioning between the bones of the spine (vertebrae).  Twisting motions, such as while playing sports or doing yard work.  A hit to the back.  Arthritis. You may have a physical exam, lab tests, and imaging tests to find the cause of your pain. Acute back pain usually goes away with rest and home care. Follow these instructions at home: Managing pain, stiffness, and swelling  Treatment may include medicines for pain and inflammation that are taken by mouth or applied to the skin, prescription pain medicine, or muscle relaxants. Take over-the-counter and prescription medicines only as told by your health care provider.  Your health care provider may recommend applying ice during the first 24-48 hours after your pain starts. To do this: ? Put ice in a plastic bag. ? Place a towel between your skin and the bag. ? Leave the ice on for 20 minutes, 2-3 times a day.  If directed, apply heat to the affected area as often as told by your health care provider. Use the heat source that your health care provider recommends, such as a moist heat pack or a heating pad. ? Place a towel between your skin and the heat source. ? Leave the heat on for 20-30 minutes. ? Remove the heat if your skin turns bright red. This is especially important if you are unable to feel pain, heat, or cold. You have a greater risk of getting burned. Activity  Do not stay in bed. Staying in bed for more than 1-2 days can delay your recovery.  Sit up and stand up straight. Avoid leaning  forward when you sit or hunching over when you stand. ? If you work at a desk, sit close to it so you do not need to lean over. Keep your chin tucked in. Keep your neck drawn back, and keep your elbows bent at a 90-degree angle (right angle). ? Sit high and close to the steering wheel when you drive. Add lower back (lumbar) support to your car seat, if needed.  Take short walks on even surfaces as soon as you are able. Try to increase the length of time you walk each day.  Do not sit, drive, or stand in one place for more than 30 minutes at a time. Sitting or standing for long periods of time can put stress on your back.  Do not drive or use heavy machinery while taking prescription pain medicine.  Use proper lifting techniques. When you bend and lift, use positions that put less stress on your back: ? [place] your knees. ? Keep the load close to your body. ? Avoid twisting.  Exercise regularly as told by your health care provider. Exercising helps your back heal faster and helps prevent back injuries by keeping muscles strong and flexible.  Work with a physical therapist to make a safe exercise program, as recommended by your health care provider. Do any exercises as told by your physical therapist.   Lifestyle  Maintain a healthy weight. Extra weight puts stress on your back and makes it difficult to have  good posture.  Avoid activities or situations that make you feel anxious or stressed. Stress and anxiety increase muscle tension and can make back pain worse. Learn ways to manage anxiety and stress, such as through exercise. General instructions  Sleep on a firm mattress in a comfortable position. Try lying on your side with your knees slightly bent. If you lie on your back, put a pillow under your knees.  Follow your treatment plan as told by your health care provider. This may include: ? Cognitive or behavioral therapy. ? Acupuncture or massage therapy. ? Meditation or yoga. Contact  a health care provider if:  You have pain that is not relieved with rest or medicine.  You have increasing pain going down into your legs or buttocks.  Your pain does not improve after 2 weeks.  You have pain at night.  You lose weight without trying.  You have a fever or chills. Get help right away if:  You develop new bowel or bladder control problems.  You have unusual weakness or numbness in your arms or legs.  You develop nausea or vomiting.  You develop abdominal pain.  You feel faint. Summary  Acute back pain is sudden and usually short-lived.  Use proper lifting techniques. When you bend and lift, use positions that put less stress on your back.  Take over-the-counter and prescription medicines and apply heat or ice as directed by your health care provider. This information is not intended to replace advice given to you by your health care provider. Make sure you discuss any questions you have with your health care provider. Document Revised: 10/21/2019 Document Reviewed: 10/21/2019 Elsevier Patient Education  2021 Elsevier Inc.  

## 2020-07-03 NOTE — Assessment & Plan Note (Addendum)
New back pain in the last 5 to 6 days.  Symptoms of pain and decreased range of motion not well controlled.  Pain does not radiate.  Patient tried heat and Tylenol with very mild therapeutic effect.  Started patient on Robaxin, 80 Depo-Medrol shot given in clinic, continue Tylenol as prescribed, prednisone taper.  Completed back x-ray with results pending..  Education provided to patient with printed handouts given. Rx sent to pharmacy. Follow-up with worsening or unresolved symptoms.

## 2020-07-03 NOTE — Progress Notes (Signed)
Acute Office Visit  Subjective:    Patient ID: Rachael Bryant, female    DOB: October 29, 1950, 70 y.o.   MRN: [id number]  Chief Complaint  Patient presents with  . Back Pain    Back Pain This is a new problem. The current episode started in the past 7 days. The problem occurs constantly. The problem has been gradually worsening since onset. The pain is present in the lumbar spine. The quality of the pain is described as aching. The pain does not radiate. The pain is at a severity of 6/10. The pain is moderate. The pain is the same all the time. The symptoms are aggravated by bending, position and twisting. Pertinent negatives include no abdominal pain, bladder incontinence, bowel incontinence, leg pain, numbness, paresthesias or tingling. She has tried heat for the symptoms. The treatment provided no relief.     Past Medical History:  Diagnosis Date  . Anxiety   . High cholesterol   . 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 TUBAL LIGATION      Family History  Problem Relation Age of Onset  . Heart attack Father   . Diabetes Mother   . Pulmonary fibrosis Mother   . Parkinsonism Sister     Social History   Socioeconomic History  . Marital status: Married    Spouse name: Remo Lipps   . Number of children: 2  . Years of education: 31  . Highest education level: Not on file  Occupational History  . Not on file  Tobacco Use  . Smoking status: Never Smoker  . Smokeless tobacco: Never Used  Vaping Use  . Vaping Use: Never used  Substance and Sexual Activity  . Alcohol use: No    Alcohol/week: 0.0 standard drinks  . Drug use: No  . Sexual activity: Not on file  Other Topics Concern  . Not on file  Social History Narrative   Lives at home with husband, Remo Lipps.   Caffeine use: Drinks coffee/soda (7 cups per week)   Drinks 2 Liters per week   Social Determinants of Systems developer Strain: Not on file  Food Insecurity: Not on file  Transportation Needs: Not on file  Physical Activity: Not on file  Stress: Not on file  Social Connections: Not on file  Intimate Partner Violence: Not on file    Outpatient Medications Prior to Visit  Medication Sig Dispense Refill  . alendronate (FOSAMAX) 70 MG tablet Take 1 tablet (70 mg total) by mouth once a week. 13 tablet 3  . calcium citrate-vitamin D (CITRACAL+D) 315-200 MG-UNIT tablet Take 1 tablet by mouth 2 (two) times daily.    . citalopram (CELEXA) 10 MG tablet Take 1 tablet (10 mg total) by mouth daily. 90 tablet 3  . cyanocobalamin 1000 MCG tablet Take 1,000 mcg by mouth daily.    . diphenhydrAMINE (BENADRYL) 25 mg capsule Take 50 mg by mouth at bedtime.    . Omega-3 Fatty Acids (FISH OIL) 1200 MG CAPS Take by mouth.    . propranolol ER (INDERAL LA) 80 MG 24 hr capsule Take 1 capsule (80 mg total) by mouth daily. 90 capsule 3  . simvastatin (ZOCOR) 20 MG tablet TAKE 1 TABLET BY MOUTH EVERYDAY AT BEDTIME 90 tablet 3  . trimethoprim-polymyxin b (POLYTRIM) ophthalmic solution USE 1 DROP IN RIGHT EYE 4 TIMES DAILY FOR 2 DAYS AFTER EACH MONTHLY  EYE INJECTION    . Vitamin D, Ergocalciferol, (DRISDOL) 1.25 MG (50000 UNIT) CAPS capsule Take 1 capsule (50,000 Units total) by mouth every 7 (seven) days. 13 capsule 3   No facility-administered medications prior to visit.    Allergies  Allergen Reactions  . Acetaminophen Hives  . Ibuprofen Hives  . Other Hives    NSAIDS  . Codeine Nausea Only    Review of Systems  Constitutional: Negative.   HENT: Negative.   Respiratory: Negative.   Cardiovascular: Negative.   Gastrointestinal: Negative for abdominal pain and bowel incontinence.  Genitourinary: Negative.  Negative for bladder incontinence.  Musculoskeletal: Positive for back pain.  Neurological: Negative for tingling, numbness and paresthesias.  All other systems reviewed and are negative.      Objective:     Physical Exam Vitals and nursing note reviewed.  Constitutional:      Appearance: She is normal weight.  HENT:     Nose: Nose normal.  Eyes:     Conjunctiva/sclera: Conjunctivae normal.  Cardiovascular:     Rate and Rhythm: Normal rate and regular rhythm.     Pulses: Normal pulses.  Pulmonary:     Effort: Pulmonary effort is normal.     Breath sounds: Normal breath sounds.  Abdominal:     General: Bowel sounds are normal.  Musculoskeletal:     Lumbar back: Tenderness present. No lacerations. Decreased range of motion.       Back:     Comments: New back pain. Decreased ROM   Skin:    Findings: No rash.  Neurological:     Mental Status: She is alert and oriented to person, place, and time.     BP 138/83   Pulse 67   Temp (!) 97.5 F (36.4 C) (Temporal)   Ht 5' 7" (1.702 m)   Wt 176 lb (79.8 kg)   SpO2 97%   BMI 27.57 kg/m  Wt Readings from Last 3 Encounters:  07/03/20 176 lb (79.8 kg)  04/30/20 178 lb 3.2 oz (80.8 kg)  11/01/19 176 lb 12.8 oz (80.2 kg)    Health Maintenance Due  Topic Date Due  . COLONOSCOPY (Pts 45-38yr Insurance coverage will need to be confirmed)  Never done  . MAMMOGRAM  Never done  . COVID-19 Vaccine (3 - Moderna risk 4-dose series) 05/17/2019    There are no preventive care reminders to display for this patient.   Lab Results  Component Value Date   TSH 0.519 09/10/2018   Lab Results  Component Value Date   WBC 5.5 04/30/2020   HGB 13.6 04/30/2020   HCT 41.3 04/30/2020   MCV 90 04/30/2020   PLT 272 04/30/2020   Lab Results  Component Value Date   NA 139 04/30/2020   K 4.6 04/30/2020   CO2 24 04/30/2020   GLUCOSE 109 (H) 04/30/2020   BUN 15 04/30/2020   CREATININE 0.81 04/30/2020   BILITOT 0.4 04/30/2020   ALKPHOS 94 04/30/2020   AST 21 04/30/2020   ALT 19 04/30/2020   PROT 6.8 04/30/2020   ALBUMIN 4.3 04/30/2020   CALCIUM 9.1 04/30/2020   EGFR 80 04/30/2020   Lab Results  Component Value Date   CHOL 181  04/30/2020   Lab Results  Component Value Date   HDL 47 04/30/2020   Lab Results  Component Value Date   LDLCALC 113 (H) 04/30/2020   Lab Results  Component Value Date   TRIG 115 04/30/2020   Lab Results  Component Value  Date   CHOLHDL 3.9 04/30/2020   No results found for: HGBA1C     Assessment & Plan:   Problem List Items Addressed This Visit      Other   Acute bilateral low back pain without sciatica - Primary    New back pain in the last 5 to 6 days.  Symptoms of pain and decreased range of motion not well controlled.  Pain does not radiate.  Patient tried heat and Tylenol with very mild therapeutic effect.  Started patient on Robaxin, 80 Depo-Medrol shot given in clinic, continue Tylenol as prescribed, prednisone taper.  Completed back x-ray with results pending..  Education provided to patient with printed handouts given. Rx sent to pharmacy. Follow-up with worsening or unresolved symptoms.       Relevant Medications   methocarbamol (ROBAXIN) 500 MG tablet   predniSONE (STERAPRED UNI-PAK 21 TAB) 10 MG (21) TBPK tablet   Other Relevant Orders   DG Lumbar Spine 2-3 Views       Meds ordered this encounter  Medications  . methocarbamol (ROBAXIN) 500 MG tablet    Sig: Take 1 tablet (500 mg total) by mouth 4 (four) times daily.    Dispense:  30 tablet    Refill:  0    Order Specific Question:   Supervising Provider    Answer:   Janora Norlander [[id number]]  . predniSONE (STERAPRED UNI-PAK 21 TAB) 10 MG (21) TBPK tablet    Sig: 6 tablets day 1, 5 tablets day 2, 4 tablets day 3, 3 tablets day 4, 2 tablet day 5, 1 tablet day 6.    Dispense:  21 tablet    Refill:  0    Order Specific Question:   Supervising Provider    Answer:   Janora Norlander [[id number]]     Ivy Lynn, NP

## 2020-07-03 NOTE — Addendum Note (Signed)
Addended byCarrolyn Leigh on: 07/03/2020 10:51 AM   Modules accepted: Orders

## 2020-07-10 ENCOUNTER — Encounter (INDEPENDENT_AMBULATORY_CARE_PROVIDER_SITE_OTHER): Payer: Medicare Other | Admitting: Ophthalmology

## 2020-07-10 ENCOUNTER — Other Ambulatory Visit: Payer: Self-pay

## 2020-07-10 DIAGNOSIS — H353121 Nonexudative age-related macular degeneration, left eye, early dry stage: Secondary | ICD-10-CM | POA: Diagnosis not present

## 2020-07-10 DIAGNOSIS — H43813 Vitreous degeneration, bilateral: Secondary | ICD-10-CM | POA: Diagnosis not present

## 2020-07-10 DIAGNOSIS — H2513 Age-related nuclear cataract, bilateral: Secondary | ICD-10-CM | POA: Diagnosis not present

## 2020-07-10 DIAGNOSIS — H353211 Exudative age-related macular degeneration, right eye, with active choroidal neovascularization: Secondary | ICD-10-CM | POA: Diagnosis not present

## 2020-07-18 DIAGNOSIS — R922 Inconclusive mammogram: Secondary | ICD-10-CM | POA: Diagnosis not present

## 2020-07-18 DIAGNOSIS — N644 Mastodynia: Secondary | ICD-10-CM | POA: Diagnosis not present

## 2020-07-24 ENCOUNTER — Other Ambulatory Visit: Payer: Self-pay | Admitting: *Deleted

## 2020-07-24 DIAGNOSIS — M545 Low back pain, unspecified: Secondary | ICD-10-CM

## 2020-07-24 MED ORDER — METHOCARBAMOL 500 MG PO TABS: 500.0000 mg | ORAL_TABLET | Freq: Four times a day (QID) | ORAL | 2 refills | Status: DC

## 2020-08-02 DIAGNOSIS — C44311 Basal cell carcinoma of skin of nose: Secondary | ICD-10-CM | POA: Diagnosis not present

## 2020-08-07 ENCOUNTER — Encounter (INDEPENDENT_AMBULATORY_CARE_PROVIDER_SITE_OTHER): Payer: Medicare Other | Admitting: Ophthalmology

## 2020-08-21 ENCOUNTER — Other Ambulatory Visit: Payer: Self-pay

## 2020-08-21 ENCOUNTER — Encounter (INDEPENDENT_AMBULATORY_CARE_PROVIDER_SITE_OTHER): Payer: Medicare Other | Admitting: Ophthalmology

## 2020-08-21 DIAGNOSIS — H353211 Exudative age-related macular degeneration, right eye, with active choroidal neovascularization: Secondary | ICD-10-CM

## 2020-08-21 DIAGNOSIS — H43813 Vitreous degeneration, bilateral: Secondary | ICD-10-CM | POA: Diagnosis not present

## 2020-08-21 DIAGNOSIS — H353121 Nonexudative age-related macular degeneration, left eye, early dry stage: Secondary | ICD-10-CM

## 2020-08-24 DIAGNOSIS — Z7689 Persons encountering health services in other specified circumstances: Secondary | ICD-10-CM | POA: Diagnosis not present

## 2020-09-04 ENCOUNTER — Ambulatory Visit (INDEPENDENT_AMBULATORY_CARE_PROVIDER_SITE_OTHER): Payer: Medicare Other | Admitting: Family Medicine

## 2020-09-04 ENCOUNTER — Encounter: Payer: Self-pay | Admitting: Family Medicine

## 2020-09-04 ENCOUNTER — Other Ambulatory Visit: Payer: Self-pay

## 2020-09-04 VITALS — BP 141/78 | HR 74 | Temp 97.9°F | Ht 67.0 in | Wt 178.6 lb

## 2020-09-04 DIAGNOSIS — H60392 Other infective otitis externa, left ear: Secondary | ICD-10-CM

## 2020-09-04 DIAGNOSIS — H6122 Impacted cerumen, left ear: Secondary | ICD-10-CM | POA: Diagnosis not present

## 2020-09-04 DIAGNOSIS — Z1211 Encounter for screening for malignant neoplasm of colon: Secondary | ICD-10-CM | POA: Diagnosis not present

## 2020-09-04 DIAGNOSIS — R1084 Generalized abdominal pain: Secondary | ICD-10-CM | POA: Diagnosis not present

## 2020-09-04 NOTE — Progress Notes (Signed)
Chief Complaint  Patient presents with   Cerumen Impaction    Left ear     HPI  Patient presents today for decreased hearing AS for several days. Right is normal. LEft feels full. 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 Has been using vaseline on it. Concerned some got inear and is clogging it up. Denies fever. No pain  Generalized abd discomfort. Some change is stool character. Had last colonoscopy 18 years ago. Requests referral.   PMH: Smoking status noted ROS: Per HPI  Objective: BP (!) 141/78   Pulse 74   Temp 97.9 F (36.6 C)   Ht '5\' 7"'$  (1.702 m)   Wt 178 lb 9.6 oz (81 kg)   SpO2 95%   BMI 27.97 kg/m  Gen: NAD, alert, cooperative with exam HEENT: NCAT, EOMI, PERRL. LEft EAC has erythema at the margin and the canal is occluded with wax. After lavage, the TM is clear, there is mild rritation in the canal at the margin of the prvious impaction area only. The right EAC is clear. Arlice Colt is some old scarring on the TM. Otherwise nml. Neuro: Alert and oriented, No gross deficits  Assessment and plan:  1. Impacted cerumen of left ear   2. Other infective acute otitis externa of left ear   3. Screen for colon cancer   4. Generalized abdominal pain     No orders of the defined types were placed in this encounter.   Orders Placed This Encounter  Procedures   Ambulatory referral to Gastroenterology    Referral Priority:   Routine    Referral Type:   Consultation    Referral Reason:   Specialty Services Required    Number of Visits Requested:   1     Follow up as needed.  Claretta Fraise, MD

## 2020-09-06 ENCOUNTER — Encounter (INDEPENDENT_AMBULATORY_CARE_PROVIDER_SITE_OTHER): Payer: Self-pay | Admitting: *Deleted

## 2020-09-18 ENCOUNTER — Encounter (INDEPENDENT_AMBULATORY_CARE_PROVIDER_SITE_OTHER): Payer: Medicare Other | Admitting: Ophthalmology

## 2020-09-18 ENCOUNTER — Other Ambulatory Visit: Payer: Self-pay

## 2020-09-18 DIAGNOSIS — H43813 Vitreous degeneration, bilateral: Secondary | ICD-10-CM | POA: Diagnosis not present

## 2020-09-18 DIAGNOSIS — H2513 Age-related nuclear cataract, bilateral: Secondary | ICD-10-CM | POA: Diagnosis not present

## 2020-09-18 DIAGNOSIS — H353211 Exudative age-related macular degeneration, right eye, with active choroidal neovascularization: Secondary | ICD-10-CM

## 2020-09-18 DIAGNOSIS — H353121 Nonexudative age-related macular degeneration, left eye, early dry stage: Secondary | ICD-10-CM

## 2020-10-14 ENCOUNTER — Other Ambulatory Visit: Payer: Self-pay | Admitting: Family Medicine

## 2020-10-14 DIAGNOSIS — G250 Essential tremor: Secondary | ICD-10-CM

## 2020-10-16 ENCOUNTER — Encounter (INDEPENDENT_AMBULATORY_CARE_PROVIDER_SITE_OTHER): Payer: Medicare Other | Admitting: Ophthalmology

## 2020-10-16 ENCOUNTER — Other Ambulatory Visit: Payer: Self-pay

## 2020-10-16 DIAGNOSIS — H353211 Exudative age-related macular degeneration, right eye, with active choroidal neovascularization: Secondary | ICD-10-CM

## 2020-10-16 DIAGNOSIS — H43813 Vitreous degeneration, bilateral: Secondary | ICD-10-CM

## 2020-10-16 DIAGNOSIS — H353121 Nonexudative age-related macular degeneration, left eye, early dry stage: Secondary | ICD-10-CM

## 2020-10-31 ENCOUNTER — Encounter: Payer: Medicare Other | Admitting: Family Medicine

## 2020-11-13 ENCOUNTER — Encounter (INDEPENDENT_AMBULATORY_CARE_PROVIDER_SITE_OTHER): Payer: Medicare Other | Admitting: Ophthalmology

## 2020-11-13 ENCOUNTER — Other Ambulatory Visit: Payer: Self-pay

## 2020-11-13 DIAGNOSIS — H353121 Nonexudative age-related macular degeneration, left eye, early dry stage: Secondary | ICD-10-CM | POA: Diagnosis not present

## 2020-11-13 DIAGNOSIS — H43813 Vitreous degeneration, bilateral: Secondary | ICD-10-CM | POA: Diagnosis not present

## 2020-11-13 DIAGNOSIS — H353211 Exudative age-related macular degeneration, right eye, with active choroidal neovascularization: Secondary | ICD-10-CM

## 2020-12-11 ENCOUNTER — Other Ambulatory Visit: Payer: Self-pay

## 2020-12-11 ENCOUNTER — Encounter (INDEPENDENT_AMBULATORY_CARE_PROVIDER_SITE_OTHER): Payer: Medicare Other | Admitting: Ophthalmology

## 2020-12-11 DIAGNOSIS — H353121 Nonexudative age-related macular degeneration, left eye, early dry stage: Secondary | ICD-10-CM

## 2020-12-11 DIAGNOSIS — H353211 Exudative age-related macular degeneration, right eye, with active choroidal neovascularization: Secondary | ICD-10-CM

## 2020-12-11 DIAGNOSIS — H43813 Vitreous degeneration, bilateral: Secondary | ICD-10-CM | POA: Diagnosis not present

## 2020-12-13 ENCOUNTER — Encounter: Payer: Medicare Other | Admitting: Family Medicine

## 2020-12-24 ENCOUNTER — Other Ambulatory Visit: Payer: Self-pay | Admitting: Family Medicine

## 2021-01-08 ENCOUNTER — Other Ambulatory Visit: Payer: Self-pay

## 2021-01-08 ENCOUNTER — Encounter (INDEPENDENT_AMBULATORY_CARE_PROVIDER_SITE_OTHER): Payer: Medicare Other | Admitting: Ophthalmology

## 2021-01-08 DIAGNOSIS — H353211 Exudative age-related macular degeneration, right eye, with active choroidal neovascularization: Secondary | ICD-10-CM

## 2021-01-08 DIAGNOSIS — H353121 Nonexudative age-related macular degeneration, left eye, early dry stage: Secondary | ICD-10-CM | POA: Diagnosis not present

## 2021-01-08 DIAGNOSIS — H43813 Vitreous degeneration, bilateral: Secondary | ICD-10-CM | POA: Diagnosis not present

## 2021-01-10 ENCOUNTER — Other Ambulatory Visit: Payer: Self-pay

## 2021-01-10 ENCOUNTER — Encounter (INDEPENDENT_AMBULATORY_CARE_PROVIDER_SITE_OTHER): Payer: Self-pay

## 2021-01-10 ENCOUNTER — Ambulatory Visit (INDEPENDENT_AMBULATORY_CARE_PROVIDER_SITE_OTHER): Payer: Medicare Other | Admitting: Gastroenterology

## 2021-01-10 ENCOUNTER — Encounter (INDEPENDENT_AMBULATORY_CARE_PROVIDER_SITE_OTHER): Payer: Self-pay | Admitting: Gastroenterology

## 2021-01-10 ENCOUNTER — Other Ambulatory Visit (INDEPENDENT_AMBULATORY_CARE_PROVIDER_SITE_OTHER): Payer: Self-pay

## 2021-01-10 DIAGNOSIS — R198 Other specified symptoms and signs involving the digestive system and abdomen: Secondary | ICD-10-CM | POA: Insufficient documentation

## 2021-01-10 NOTE — H&P (View-Only) (Signed)
Maylon Peppers, M.D. Gastroenterology & Hepatology [facility] [address] [place], [place] [address] Primary Care Physician: Claretta Fraise, MD [place] [id number]  Referring MD: PCP  Chief Complaint: Change in bowel movement consistency  History of Present Illness: Rachael Bryant is a 70 y.o. female with PMH HLD, osteoporosis and anxiety, who presents for evaluation of change in bowel movement consistency.  Patient reports that at least for the last 6 months  she has presented a change in her bowel movement consistency. States that the stool now haas a flat shape which used to be round in the past. Has had regular Bms every day once a day after having coffee. She reports that has noticed some episodes of explosive bowel movements which are not watery but come in pieces. No fcecal soiling. The patient denies having any nausea, vomiting, fever, chills, hematochezia, melena, hematemesis, abdominal distention, abdominal pain, diarrhea, jaundice, pruritus or weight loss.  Last FOBT in 08/2020 was negative.  Notably, the patient has not started any new medications or dietary supplements recently.  Last MOL:MBEML Last Colonoscopy:50 years, normal per patient, no report available - performed by Dr. Anthony Sar  FHx: neg for any gastrointestinal/liver disease, no malignancies Social: neg smoking, alcohol or illicit drug use Surgical: no abdominal surgeries  Past Medical History: Past Medical History:  Diagnosis Date   Anxiety    High cholesterol    Nodular basal cell carcinoma (BCC) 05/02/2020   Left Alar Crease    Past Surgical History: Past Surgical History:  Procedure Laterality Date   TUBAL LIGATION      Family History: Family History  Problem Relation Age of Onset   Heart attack Father    Diabetes Mother    Pulmonary fibrosis Mother    Parkinsonism Sister     Social History: Social History    Tobacco Use  Smoking Status Never  Smokeless Tobacco Never   Social History   Substance and Sexual Activity  Alcohol Use No   Alcohol/week: 0.0 standard drinks   Social History   Substance and Sexual Activity  Drug Use No    Allergies: Allergies  Allergen Reactions   Naproxen Sodium Hives   Ibuprofen Hives   Other Hives    NSAIDS   Codeine Nausea Only    Medications: Current Outpatient Medications  Medication Sig Dispense Refill   alendronate (FOSAMAX) 70 MG tablet Take 1 tablet (70 mg total) by mouth once a week. 13 tablet 3   Calcium Carbonate (CALCIUM 600 PO) Take by mouth. Takes 3 -4 a day     citalopram (CELEXA) 10 MG tablet Take 1 tablet (10 mg total) by mouth daily. 90 tablet 3   cyanocobalamin 1000 MCG tablet Take 1,000 mcg by mouth daily.     diphenhydrAMINE (BENADRYL) 25 mg capsule Take 50 mg by mouth at bedtime.     methocarbamol (ROBAXIN) 500 MG tablet Take 1 tablet (500 mg total) by mouth 4 (four) times daily. 120 tablet 2   Omega-3 Fatty Acids (FISH OIL) 1200 MG CAPS Take by mouth.     propranolol ER (INDERAL LA) 80 MG 24 hr capsule TAKE 1 CAPSULE BY MOUTH EVERY DAY 90 capsule 3   simvastatin (ZOCOR) 20 MG tablet TAKE 1 TABLET BY MOUTH EVERYDAY AT BEDTIME (NEEDS TO BE SEEN BEFORE NEXT REFILL) 30 tablet 0   trimethoprim-polymyxin b (POLYTRIM) ophthalmic solution USE 1 DROP IN RIGHT EYE 4 TIMES DAILY FOR 2 DAYS AFTER  EACH MONTHLY EYE INJECTION     Vitamin D, Ergocalciferol, (DRISDOL) 1.25 MG (50000 UNIT) CAPS capsule TAKE 1 CAPSULE (50,000 UNITS TOTAL) BY MOUTH EVERY 7 (SEVEN) DAYS. 13 capsule 3   No current facility-administered medications for this visit.    Review of Systems: GENERAL: negative for malaise, night sweats HEENT: No changes in hearing or vision, no nose bleeds or other nasal problems. NECK: Negative for lumps, goiter, pain and significant neck swelling RESPIRATORY: Negative for cough, wheezing CARDIOVASCULAR: Negative for chest pain,  leg swelling, palpitations, orthopnea GI: SEE HPI MUSCULOSKELETAL: Negative for joint pain or swelling, back pain, and muscle pain. SKIN: Negative for lesions, rash PSYCH: Negative for sleep disturbance, mood disorder and recent psychosocial stressors. HEMATOLOGY Negative for prolonged bleeding, bruising easily, and swollen nodes. ENDOCRINE: Negative for cold or heat intolerance, polyuria, polydipsia and goiter. NEURO: negative for tremor, gait imbalance, syncope and seizures. The remainder of the review of systems is noncontributory.   Physical Exam: BP 134/85 (BP Location: Right Arm, Patient Position: Sitting, Cuff Size: Large)   Pulse 70   Temp 98.4 F (36.9 C) (Oral)   Ht 5\' 7"  (1.702 m)   Wt 179 lb 1.6 oz (81.2 kg)   BMI 28.05 kg/m  GENERAL: The patient is AO x3, in no acute distress. HEENT: Head is normocephalic and atraumatic. EOMI are intact. Mouth is well hydrated and without lesions. NECK: Supple. No masses LUNGS: Clear to auscultation. No presence of rhonchi/wheezing/rales. Adequate chest expansion HEART: RRR, normal s1 and s2. ABDOMEN: mildly tender in LUQ, no guarding, no peritoneal signs, and nondistended. BS +. No masses. EXTREMITIES: Without any cyanosis, clubbing, rash, lesions or edema. NEUROLOGIC: AOx3, no focal motor deficit. SKIN: no jaundice, no rashes   Imaging/Labs: as above  I personally reviewed and interpreted the available labs, imaging and endoscopic files.  Impression and Plan: Rachael Bryant is a 70 y.o. female with PMH HLD, osteoporosis and anxiety, who presents for evaluation of change in bowel movement consistency.  The patient has presented recent change in her bowel movement consistency without a clear explanation in terms of her diet or medications.  As she has not had a colonoscopy in the last 10 years, we will need to explore her symptoms further with a colonoscopy to rule out etiologies such as colorectal cancer.  She understood and  agreed.  She has not presented any other red flag signs and her symptoms do not bother her severely, but she may benefit from taking Benefiber to increase the bulk of her stools.  Patient understood and agreed.  - Schedule colonoscopy - Start Benefiber fiber supplements daily to increase stool bulk  All questions were answered.      Maylon Peppers, MD Gastroenterology and Hepatology [facility]

## 2021-01-10 NOTE — Patient Instructions (Signed)
Rachael Bryant  01/10/2021     @PREFPERIOPPHARMACY @   Your procedure is scheduled on  01/15/2021.   Report to [facility] at  0930  A.M.    Call this number if you have problems the morning of surgery:  [phone]   Remember:  Follow the diet and prep Instructions given to you by the office.    Take these medicines the morning of surgery with A SIP OF WATER              celexa, robaxin (if needed), inderal.     Do not wear jewelry, make-up or nail polish.  Do not wear lotions, powders, or perfumes, or deodorant.  Do not shave 48 hours prior to surgery.  Men may shave face and neck.  Do not bring valuables to the hospital.  [facility] is not responsible for any belongings or valuables.  Contacts, dentures or bridgework may not be worn into surgery.  Leave your suitcase in the car.  After surgery it may be brought to your room.  For patients admitted to the hospital, discharge time will be determined by your treatment team.  Patients discharged the day of surgery will not be allowed to drive home and must have someone with them for 24 hours.    Special instructions:   DO NOT smoke tobacco or vape for 24 hours before your procedure.  Please read over the following fact sheets that you were given. Anesthesia Post-op Instructions and Care and Recovery After Surgery      Colonoscopy, Adult, Care After This sheet gives you information about how to care for yourself after your procedure. Your health care provider may also give you more specific instructions. If you have problems or questions, contact your health care provider. What can I expect after the procedure? After the procedure, it is common to have: A small amount of blood in your stool for 24 hours after the procedure. Some gas. Mild cramping or bloating of your abdomen. Follow these instructions at home: Eating and drinking  Drink enough fluid to keep your urine pale yellow. Follow instructions  from your health care provider about eating or drinking restrictions. Resume your normal diet as instructed by your health care provider. Avoid heavy or fried foods that are hard to digest. Activity Rest as told by your health care provider. Avoid sitting for a long time without moving. Get up to take short walks every 1-2 hours. This is important to improve blood flow and breathing. Ask for help if you feel weak or unsteady. Return to your normal activities as told by your health care provider. Ask your health care provider what activities are safe for you. Managing cramping and bloating  Try walking around when you have cramps or feel bloated. Apply heat to your abdomen as told by your health care provider. Use the heat source that your health care provider recommends, such as a moist heat pack or a heating pad. Place a towel between your skin and the heat source. Leave the heat on for 20-30 minutes. Remove the heat if your skin turns bright red. This is especially important if you are unable to feel pain, heat, or cold. You may have a greater risk of getting burned. General instructions If you were given a sedative during the procedure, it can affect you for several hours. Do not drive or operate machinery until your health care provider says that it is safe. For the first  24 hours after the procedure: Do not sign important documents. Do not drink alcohol. Do your regular daily activities at a slower pace than normal. Eat soft foods that are easy to digest. Take over-the-counter and prescription medicines only as told by your health care provider. Keep all follow-up visits as told by your health care provider. This is important. Contact a health care provider if: You have blood in your stool 2-3 days after the procedure. Get help right away if you have: More than a small spotting of blood in your stool. Large blood clots in your stool. Swelling of your abdomen. Nausea or vomiting. A  fever. Increasing pain in your abdomen that is not relieved with medicine. Summary After the procedure, it is common to have a small amount of blood in your stool. You may also have mild cramping and bloating of your abdomen. If you were given a sedative during the procedure, it can affect you for several hours. Do not drive or operate machinery until your health care provider says that it is safe. Get help right away if you have a lot of blood in your stool, nausea or vomiting, a fever, or increased pain in your abdomen. This information is not intended to replace advice given to you by your health care provider. Make sure you discuss any questions you have with your health care provider. Document Revised: 12/03/2018 Document Reviewed: 08/23/2018 Elsevier Patient Education  [place] After This sheet gives you information about how to care for yourself after your procedure. Your health care provider may also give you more specific instructions. If you have problems or questions, contact your health care provider. What can I expect after the procedure? After the procedure, it is common to have: Tiredness. Forgetfulness about what happened after the procedure. Impaired judgment for important decisions. Nausea or vomiting. Some difficulty with balance. Follow these instructions at home: For the time period you were told by your health care provider:   Rest as needed. Do not participate in activities where you could fall or become injured. Do not drive or use machinery. Do not drink alcohol. Do not take sleeping pills or medicines that cause drowsiness. Do not make important decisions or sign legal documents. Do not take care of children on your own. Eating and drinking Follow the diet that is recommended by your health care provider. Drink enough fluid to keep your urine pale yellow. If you vomit: Drink water, juice, or soup when you can drink  without vomiting. Make sure you have little or no nausea before eating solid foods. General instructions Have a responsible adult stay with you for the time you are told. It is important to have someone help care for you until you are awake and alert. Take over-the-counter and prescription medicines only as told by your health care provider. If you have sleep apnea, surgery and certain medicines can increase your risk for breathing problems. Follow instructions from your health care provider about wearing your sleep device: Anytime you are sleeping, including during daytime naps. While taking prescription pain medicines, sleeping medicines, or medicines that make you drowsy. Avoid smoking. Keep all follow-up visits as told by your health care provider. This is important. Contact a health care provider if: You keep feeling nauseous or you keep vomiting. You feel light-headed. You are still sleepy or having trouble with balance after 24 hours. You develop a rash. You have a fever. You have redness or swelling around the IV  site. Get help right away if: You have trouble breathing. You have new-onset confusion at home. Summary For several hours after your procedure, you may feel tired. You may also be forgetful and have poor judgment. Have a responsible adult stay with you for the time you are told. It is important to have someone help care for you until you are awake and alert. Rest as told. Do not drive or operate machinery. Do not drink alcohol or take sleeping pills. Get help right away if you have trouble breathing, or if you suddenly become confused. This information is not intended to replace advice given to you by your health care provider. Make sure you discuss any questions you have with your health care provider. Document Revised: 10/13/2019 Document Reviewed: 12/30/2018 Elsevier Patient Education  2022 [employer].

## 2021-01-10 NOTE — Patient Instructions (Signed)
Schedule colonoscopy Start Benefiber fiber supplements daily to increase stool bulk

## 2021-01-10 NOTE — Progress Notes (Signed)
Rachael Bryant, M.D. Gastroenterology & Hepatology [facility] [address] [place], [place] [address] Primary Care Physician: Claretta Fraise, MD [place] [id number]  Referring MD: PCP  Chief Complaint: Change in bowel movement consistency  History of Present Illness: Rachael Bryant is a 70 y.o. female with PMH HLD, osteoporosis and anxiety, who presents for evaluation of change in bowel movement consistency.  Patient reports that at least for the last 6 months  she has presented a change in her bowel movement consistency. States that the stool now haas a flat shape which used to be round in the past. Has had regular Bms every day once a day after having coffee. She reports that has noticed some episodes of explosive bowel movements which are not watery but come in pieces. No fcecal soiling. The patient denies having any nausea, vomiting, fever, chills, hematochezia, melena, hematemesis, abdominal distention, abdominal pain, diarrhea, jaundice, pruritus or weight loss.  Last FOBT in 08/2020 was negative.  Notably, the patient has not started any new medications or dietary supplements recently.  Last SHF:WYOVZ Last Colonoscopy:50 years, normal per patient, no report available - performed by Dr. Anthony Sar  FHx: neg for any gastrointestinal/liver disease, no malignancies Social: neg smoking, alcohol or illicit drug use Surgical: no abdominal surgeries  Past Medical History: Past Medical History:  Diagnosis Date   Anxiety    High cholesterol    Nodular basal cell carcinoma (BCC) 05/02/2020   Left Alar Crease    Past Surgical History: Past Surgical History:  Procedure Laterality Date   TUBAL LIGATION      Family History: Family History  Problem Relation Age of Onset   Heart attack Father    Diabetes Mother    Pulmonary fibrosis Mother    Parkinsonism Sister     Social History: Social History    Tobacco Use  Smoking Status Never  Smokeless Tobacco Never   Social History   Substance and Sexual Activity  Alcohol Use No   Alcohol/week: 0.0 standard drinks   Social History   Substance and Sexual Activity  Drug Use No    Allergies: Allergies  Allergen Reactions   Naproxen Sodium Hives   Ibuprofen Hives   Other Hives    NSAIDS   Codeine Nausea Only    Medications: Current Outpatient Medications  Medication Sig Dispense Refill   alendronate (FOSAMAX) 70 MG tablet Take 1 tablet (70 mg total) by mouth once a week. 13 tablet 3   Calcium Carbonate (CALCIUM 600 PO) Take by mouth. Takes 3 -4 a day     citalopram (CELEXA) 10 MG tablet Take 1 tablet (10 mg total) by mouth daily. 90 tablet 3   cyanocobalamin 1000 MCG tablet Take 1,000 mcg by mouth daily.     diphenhydrAMINE (BENADRYL) 25 mg capsule Take 50 mg by mouth at bedtime.     methocarbamol (ROBAXIN) 500 MG tablet Take 1 tablet (500 mg total) by mouth 4 (four) times daily. 120 tablet 2   Omega-3 Fatty Acids (FISH OIL) 1200 MG CAPS Take by mouth.     propranolol ER (INDERAL LA) 80 MG 24 hr capsule TAKE 1 CAPSULE BY MOUTH EVERY DAY 90 capsule 3   simvastatin (ZOCOR) 20 MG tablet TAKE 1 TABLET BY MOUTH EVERYDAY AT BEDTIME (NEEDS TO BE SEEN BEFORE NEXT REFILL) 30 tablet 0   trimethoprim-polymyxin b (POLYTRIM) ophthalmic solution USE 1 DROP IN RIGHT EYE 4 TIMES DAILY FOR 2 DAYS AFTER  EACH MONTHLY EYE INJECTION     Vitamin D, Ergocalciferol, (DRISDOL) 1.25 MG (50000 UNIT) CAPS capsule TAKE 1 CAPSULE (50,000 UNITS TOTAL) BY MOUTH EVERY 7 (SEVEN) DAYS. 13 capsule 3   No current facility-administered medications for this visit.    Review of Systems: GENERAL: negative for malaise, night sweats HEENT: No changes in hearing or vision, no nose bleeds or other nasal problems. NECK: Negative for lumps, goiter, pain and significant neck swelling RESPIRATORY: Negative for cough, wheezing CARDIOVASCULAR: Negative for chest pain,  leg swelling, palpitations, orthopnea GI: SEE HPI MUSCULOSKELETAL: Negative for joint pain or swelling, back pain, and muscle pain. SKIN: Negative for lesions, rash PSYCH: Negative for sleep disturbance, mood disorder and recent psychosocial stressors. HEMATOLOGY Negative for prolonged bleeding, bruising easily, and swollen nodes. ENDOCRINE: Negative for cold or heat intolerance, polyuria, polydipsia and goiter. NEURO: negative for tremor, gait imbalance, syncope and seizures. The remainder of the review of systems is noncontributory.   Physical Exam: BP 134/85 (BP Location: Right Arm, Patient Position: Sitting, Cuff Size: Large)   Pulse 70   Temp 98.4 F (36.9 C) (Oral)   Ht 5\' 7"  (1.702 m)   Wt 179 lb 1.6 oz (81.2 kg)   BMI 28.05 kg/m  GENERAL: The patient is AO x3, in no acute distress. HEENT: Head is normocephalic and atraumatic. EOMI are intact. Mouth is well hydrated and without lesions. NECK: Supple. No masses LUNGS: Clear to auscultation. No presence of rhonchi/wheezing/rales. Adequate chest expansion HEART: RRR, normal s1 and s2. ABDOMEN: mildly tender in LUQ, no guarding, no peritoneal signs, and nondistended. BS +. No masses. EXTREMITIES: Without any cyanosis, clubbing, rash, lesions or edema. NEUROLOGIC: AOx3, no focal motor deficit. SKIN: no jaundice, no rashes   Imaging/Labs: as above  I personally reviewed and interpreted the available labs, imaging and endoscopic files.  Impression and Plan: Rachael Bryant is a 70 y.o. female with PMH HLD, osteoporosis and anxiety, who presents for evaluation of change in bowel movement consistency.  The patient has presented recent change in her bowel movement consistency without a clear explanation in terms of her diet or medications.  As she has not had a colonoscopy in the last 10 years, we will need to explore her symptoms further with a colonoscopy to rule out etiologies such as colorectal cancer.  She understood and  agreed.  She has not presented any other red flag signs and her symptoms do not bother her severely, but she may benefit from taking Benefiber to increase the bulk of her stools.  Patient understood and agreed.  - Schedule colonoscopy - Start Benefiber fiber supplements daily to increase stool bulk  All questions were answered.      Rachael Peppers, MD Gastroenterology and Hepatology [facility]

## 2021-01-11 ENCOUNTER — Encounter (HOSPITAL_COMMUNITY)
Admission: RE | Admit: 2021-01-11 | Discharge: 2021-01-11 | Disposition: A | Discharge status: Home / Self Care | Payer: Medicare Other | Source: Ambulatory Visit | Attending: Gastroenterology | Admitting: Gastroenterology

## 2021-01-12 ENCOUNTER — Other Ambulatory Visit: Payer: Self-pay | Admitting: Family Medicine

## 2021-01-14 ENCOUNTER — Other Ambulatory Visit: Payer: Self-pay

## 2021-01-14 ENCOUNTER — Encounter (HOSPITAL_COMMUNITY): Payer: Self-pay

## 2021-01-14 ENCOUNTER — Encounter (HOSPITAL_COMMUNITY)
Admission: RE | Admit: 2021-01-14 | Discharge: 2021-01-14 | Disposition: A | Discharge status: Home / Self Care | Payer: Medicare Other | Source: Ambulatory Visit | Attending: Gastroenterology | Admitting: Gastroenterology

## 2021-01-14 DIAGNOSIS — Z01810 Encounter for preprocedural cardiovascular examination: Secondary | ICD-10-CM | POA: Diagnosis not present

## 2021-01-14 HISTORY — DX: Other specified postprocedural states: Z98.890

## 2021-01-14 HISTORY — DX: Nausea with vomiting, unspecified: R11.2

## 2021-01-14 HISTORY — DX: Other specified postprocedural states: R11.2

## 2021-01-15 ENCOUNTER — Ambulatory Visit (HOSPITAL_COMMUNITY): Payer: Medicare Other | Admitting: Anesthesiology

## 2021-01-15 ENCOUNTER — Encounter (HOSPITAL_COMMUNITY): Payer: Self-pay | Admitting: Gastroenterology

## 2021-01-15 ENCOUNTER — Encounter (HOSPITAL_COMMUNITY)
Admission: RE | Disposition: A | Discharge status: Home / Self Care | Payer: Self-pay | Source: Home / Self Care | Attending: Gastroenterology

## 2021-01-15 ENCOUNTER — Ambulatory Visit (HOSPITAL_COMMUNITY)
Admission: RE | Admit: 2021-01-15 | Discharge: 2021-01-15 | Disposition: A | Discharge status: Home / Self Care | Payer: Medicare Other | Attending: Gastroenterology | Admitting: Gastroenterology

## 2021-01-15 DIAGNOSIS — R194 Change in bowel habit: Secondary | ICD-10-CM | POA: Insufficient documentation

## 2021-01-15 DIAGNOSIS — D124 Benign neoplasm of descending colon: Secondary | ICD-10-CM | POA: Diagnosis not present

## 2021-01-15 DIAGNOSIS — K648 Other hemorrhoids: Secondary | ICD-10-CM | POA: Diagnosis not present

## 2021-01-15 DIAGNOSIS — K635 Polyp of colon: Secondary | ICD-10-CM | POA: Diagnosis not present

## 2021-01-15 HISTORY — PX: POLYPECTOMY: SHX5525

## 2021-01-15 HISTORY — PX: COLONOSCOPY WITH PROPOFOL: SHX5780

## 2021-01-15 LAB — HM COLONOSCOPY

## 2021-01-15 SURGERY — COLONOSCOPY WITH PROPOFOL
Anesthesia: General

## 2021-01-15 MED ORDER — PROPOFOL 10 MG/ML IV BOLUS
INTRAVENOUS | Status: DC | PRN
  Administered 2021-01-15: 100 mg via INTRAVENOUS

## 2021-01-15 MED ORDER — PHENYLEPHRINE 40 MCG/ML (10ML) SYRINGE FOR IV PUSH (FOR BLOOD PRESSURE SUPPORT)
PREFILLED_SYRINGE | INTRAVENOUS | Status: AC
  Filled 2021-01-15: qty 10

## 2021-01-15 MED ORDER — PHENYLEPHRINE HCL (PRESSORS) 10 MG/ML IV SOLN
INTRAVENOUS | Status: DC | PRN
  Administered 2021-01-15 (×2): 100 ug via INTRAVENOUS

## 2021-01-15 MED ORDER — PROPOFOL 500 MG/50ML IV EMUL
INTRAVENOUS | Status: DC | PRN
  Administered 2021-01-15: 150 ug/kg/min via INTRAVENOUS

## 2021-01-15 MED ORDER — LIDOCAINE HCL (CARDIAC) PF 100 MG/5ML IV SOSY
PREFILLED_SYRINGE | INTRAVENOUS | Status: DC | PRN
  Administered 2021-01-15: 40 mg via INTRAVENOUS

## 2021-01-15 MED ORDER — LACTATED RINGERS IV SOLN: INTRAVENOUS | Status: DC | PRN

## 2021-01-15 MED ORDER — PROPOFOL 10 MG/ML IV BOLUS
INTRAVENOUS | Status: AC
  Filled 2021-01-15: qty 20

## 2021-01-15 NOTE — Interval H&P Note (Signed)
History and Physical Interval Note:  01/15/2021 10:29 AM  Rachael Bryant  has presented today for surgery, with the diagnosis of Change in bowel movements.  The various methods of treatment have been discussed with the patient and family. After consideration of risks, benefits and other options for treatment, the patient has consented to  Procedure(s) with comments: COLONOSCOPY WITH PROPOFOL (N/A) - 11:15 as a surgical intervention.  The patient's history has been reviewed, patient examined, no change in status, stable for surgery.  I have reviewed the patient's chart and labs.  Questions were answered to the patient's satisfaction.     [name]

## 2021-01-15 NOTE — Transfer of Care (Signed)
Immediate Anesthesia Transfer of Care Note  Patient: [name]  Procedure(s) Performed: COLONOSCOPY WITH PROPOFOL POLYPECTOMY  Patient Location: PACU  Anesthesia Type:General  Level of Consciousness: awake, alert , oriented and patient cooperative  Airway & Oxygen Therapy: Patient Spontanous Breathing  Post-op Assessment: Report given to RN, Post -op Vital signs reviewed and stable and Patient moving all extremities X 4  Post vital signs: Reviewed and stable  Last Vitals:  Vitals Value Taken Time  BP    Temp    Pulse    Resp    SpO2      Last Pain:  Vitals:   01/15/21 1158  TempSrc:   PainSc: 0-No pain      Patients Stated Pain Goal: 5 (70/26/37 8588)  Complications: No notable events documented.

## 2021-01-15 NOTE — Anesthesia Preprocedure Evaluation (Signed)
Anesthesia Evaluation  Patient identified by MRN, date of birth, ID band Patient awake    Reviewed: Allergy & Precautions, H&P , NPO status , Patient's Chart, lab work & pertinent test results  History of Anesthesia Complications (+) PONV and history of anesthetic complications  Airway Mallampati: II  TM Distance: >3 FB Neck ROM: Full    Dental  (+) Dental Advisory Given, Teeth Intact   Pulmonary neg pulmonary ROS,    Pulmonary exam normal breath sounds clear to auscultation       Cardiovascular Exercise Tolerance: Good negative cardio ROS Normal cardiovascular exam Rhythm:Regular Rate:Normal     Neuro/Psych PSYCHIATRIC DISORDERS Anxiety Depression negative neurological ROS     GI/Hepatic negative GI ROS, Neg liver ROS,   Endo/Other  negative endocrine ROS  Renal/GU negative Renal ROS  negative genitourinary   Musculoskeletal negative musculoskeletal ROS (+)   Abdominal   Peds negative pediatric ROS (+)  Hematology negative hematology ROS (+)   Anesthesia Other Findings   Reproductive/Obstetrics negative OB ROS                            Anesthesia Physical Anesthesia Plan  ASA: 2  Anesthesia Plan: General   Post-op Pain Management:    Induction: Intravenous  PONV Risk Score and Plan: TIVA  Airway Management Planned: Nasal Cannula and Natural Airway  Additional Equipment:   Intra-op Plan:   Post-operative Plan:   Informed Consent: I have reviewed the patients History and Physical, chart, labs and discussed the procedure including the risks, benefits and alternatives for the proposed anesthesia with the patient or authorized representative who has indicated his/her understanding and acceptance.     Dental advisory given  Plan Discussed with: CRNA and Surgeon  Anesthesia Plan Comments:         Anesthesia Quick Evaluation

## 2021-01-15 NOTE — Discharge Instructions (Signed)
You are being discharged to home.  Resume your previous diet.  We are waiting for your pathology results.  Your physician has recommended a repeat colonoscopy for surveillance based on pathology results.  

## 2021-01-15 NOTE — Anesthesia Postprocedure Evaluation (Signed)
Anesthesia Post Note  Patient: Rachael Bryant  Procedure(s) Performed: COLONOSCOPY WITH PROPOFOL POLYPECTOMY  Patient location during evaluation: Phase II Anesthesia Type: General Level of consciousness: awake and alert and oriented Pain management: pain level controlled Vital Signs Assessment: post-procedure vital signs reviewed and stable Respiratory status: spontaneous breathing, nonlabored ventilation and respiratory function stable Cardiovascular status: blood pressure returned to baseline and stable Postop Assessment: no apparent nausea or vomiting Anesthetic complications: no   No notable events documented.   Last Vitals:  Vitals:   01/15/21 0957 01/15/21 1242  BP: (!) 147/86 104/85  Pulse: 73 70  Resp: 20 20  Temp: 37.4 C 36.6 C  SpO2: 98% 98%    Last Pain:  Vitals:   01/15/21 1242  TempSrc: Oral  PainSc: 0-No pain                 Reinhard Schack C Cherylanne Ardelean

## 2021-01-15 NOTE — Op Note (Signed)
[facility] Patient Name: Rachael Bryant Procedure Date: 01/15/2021 11:47 AM MRN: [id number] Date of Birth: 06/27/1950 Attending MD: Maylon Peppers ,  CSN: [id number] Age: 70 Admit Type: Outpatient Procedure:                Colonoscopy Indications:              Change in bowel habits Providers:                Maylon Peppers, Janeece Riggers, RN, Kristine L.                            Risa Grill, Technician Referring MD:              Medicines:                Monitored Anesthesia Care Complications:            No immediate complications. Estimated Blood Loss:     Estimated blood loss: none. Procedure:                Pre-Anesthesia Assessment:                           - Prior to the procedure, a History and Physical                            was performed, and patient medications, allergies                            and sensitivities were reviewed. The patient's                            tolerance of previous anesthesia was reviewed.                           - The risks and benefits of the procedure and the                            sedation options and risks were discussed with the                            patient. All questions were answered and informed                            consent was obtained.                           - ASA Grade Assessment: II - A patient with mild                            systemic disease.                           After obtaining informed consent, the colonoscope                            was passed under direct vision. Throughout the  procedure, the patient's blood pressure, pulse, and                            oxygen saturations were monitored continuously. The                            [id number]-[id number] ([id number]) was introduced through the                            anus and advanced to the the cecum, identified by                            appendiceal orifice and ileocecal valve. The                             colonoscopy was performed without difficulty. The                            patient tolerated the procedure well. The quality                            of the bowel preparation was adequate. Scope In: 12:08:26 PM Scope Out: 12:30:33 PM Scope Withdrawal Time: 0 hours 16 minutes 58 seconds  Total Procedure Duration: 0 hours 22 minutes 7 seconds  Findings:      The perianal and digital rectal examinations were normal.      A 2 mm polyp was found in the descending colon. The polyp was sessile.       The polyp was removed with a cold snare. Resection and retrieval were       complete.      Non-bleeding internal hemorrhoids were found during retroflexion. The       hemorrhoids were small. Impression:               - One 2 mm polyp in the descending colon, removed                            with a cold snare. Resected and retrieved.                           - Non-bleeding internal hemorrhoids. Moderate Sedation:      Per Anesthesia Care Recommendation:           - Discharge patient to home (ambulatory).                           - Resume previous diet.                           - Await pathology results.                           - Repeat colonoscopy for surveillance based on                            pathology results. Procedure Code(s):        --- Professional ---  45385, Colonoscopy, flexible; with removal of                            tumor(s), polyp(s), or other lesion(s) by snare                            technique Diagnosis Code(s):        --- Professional ---                           K63.5, Polyp of colon                           K64.8, Other hemorrhoids                           R19.4, Change in bowel habit CPT copyright 2019 American Medical Association. All rights reserved. The codes documented in this report are preliminary and upon coder review may  be revised to meet current compliance requirements. Maylon Peppers, MD Maylon Peppers,   01/15/2021 12:36:04 PM This report has been signed electronically. Number of Addenda: 0

## 2021-01-16 LAB — SURGICAL PATHOLOGY

## 2021-01-17 ENCOUNTER — Encounter (INDEPENDENT_AMBULATORY_CARE_PROVIDER_SITE_OTHER): Payer: Self-pay | Admitting: *Deleted

## 2021-01-18 ENCOUNTER — Encounter (HOSPITAL_COMMUNITY): Payer: Self-pay | Admitting: Gastroenterology

## 2021-01-24 ENCOUNTER — Telehealth: Payer: Self-pay | Admitting: Family Medicine

## 2021-01-24 NOTE — Telephone Encounter (Signed)
Called pt to schedule AWV. No voicemail set up.

## 2021-02-04 ENCOUNTER — Other Ambulatory Visit: Payer: Self-pay | Admitting: Family Medicine

## 2021-02-06 ENCOUNTER — Ambulatory Visit (INDEPENDENT_AMBULATORY_CARE_PROVIDER_SITE_OTHER): Payer: Medicare Other

## 2021-02-06 VITALS — Ht 67.0 in | Wt 179.0 lb

## 2021-02-06 DIAGNOSIS — Z0000 Encounter for general adult medical examination without abnormal findings: Secondary | ICD-10-CM

## 2021-02-06 NOTE — Progress Notes (Signed)
Subjective:   Rachael Bryant is a 70 y.o. female who presents for an Initial Medicare Annual Wellness Visit.  Virtual Visit via Telephone Note  I connected with  Rachael Bryant on 02/06/21 at  3:30 PM EST by telephone and verified that I am speaking with the correct person using two identifiers.  Location: Patient: Home Provider: [facility] Persons participating in the virtual visit: patient/Nurse Health Advisor   I discussed the limitations, risks, security and privacy concerns of performing an evaluation and management service by telephone and the availability of in person appointments. The patient expressed understanding and agreed to proceed.  Interactive audio and video telecommunications were attempted between this nurse and patient, however failed, due to patient having technical difficulties OR patient did not have access to video capability.  We continued and completed visit with audio only.  Some vital signs may be absent or patient reported.   [name], LPN   Review of Systems     Cardiac Risk Factors include: advanced age (>60men, >62 women);dyslipidemia     Objective:    Today's Vitals   02/06/21 1523  Weight: 179 lb (81.2 kg)  Height: 5\' 7"  (1.702 m)   Body mass index is 28.04 kg/m.  Advanced Directives 02/06/2021 01/15/2021 01/14/2021  Does Patient Have a Medical Advance Directive? No No No  Would patient like information on creating a medical advance directive? No - Patient declined - No - Patient declined    Current Medications (verified) Outpatient Encounter Medications as of 02/06/2021  Medication Sig   alendronate (FOSAMAX) 70 MG tablet Take 1 tablet (70 mg total) by mouth once a week.   Calcium Carbonate (CALCIUM 600 PO) Take by mouth. Takes 3 -4 a day   citalopram (CELEXA) 10 MG tablet Take 1 tablet (10 mg total) by mouth daily.   cyanocobalamin 1000 MCG tablet Take 1,000 mcg by mouth daily.   diphenhydrAMINE (BENADRYL) 25 mg capsule Take  50 mg by mouth at bedtime.   methocarbamol (ROBAXIN) 500 MG tablet Take 1 tablet (500 mg total) by mouth 4 (four) times daily.   Omega-3 Fatty Acids (FISH OIL) 1200 MG CAPS Take by mouth.   propranolol ER (INDERAL LA) 80 MG 24 hr capsule TAKE 1 CAPSULE BY MOUTH EVERY DAY   simvastatin (ZOCOR) 20 MG tablet TAKE 1 TABLET BY MOUTH EVERYDAY AT BEDTIME (NEEDS TO BE SEEN BEFORE NEXT REFILL)   trimethoprim-polymyxin b (POLYTRIM) ophthalmic solution USE 1 DROP IN RIGHT EYE 4 TIMES DAILY FOR 2 DAYS AFTER EACH MONTHLY EYE INJECTION   Vitamin D, Ergocalciferol, (DRISDOL) 1.25 MG (50000 UNIT) CAPS capsule TAKE 1 CAPSULE (50,000 UNITS TOTAL) BY MOUTH EVERY 7 (SEVEN) DAYS.   No facility-administered encounter medications on file as of 02/06/2021.    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   Left Alar Crease   PONV (postoperative nausea and vomiting)    Past Surgical History:  Procedure Laterality Date   COLONOSCOPY WITH PROPOFOL N/A 01/15/2021   Procedure: COLONOSCOPY WITH PROPOFOL;  Surgeon: Harvel Quale, MD;  [facility];  Service: Gastroenterology;  Laterality: N/A;  11:15   POLYPECTOMY  01/15/2021   Procedure: POLYPECTOMY;  Surgeon: Harvel Quale, MD;  Location: [facility];  Service: Gastroenterology;;   TUBAL LIGATION     Family History  Problem Relation  Age of Onset   Heart attack Father    Diabetes Mother    Pulmonary fibrosis Mother    Parkinsonism Sister    Social History   Socioeconomic History   Marital status: Married    Spouse name: Remo Lipps    Number of children: 2   Years of education: 12   Highest education level: Not on file  Occupational History   Occupation: retired  Tobacco Use   Smoking status: Never   Smokeless tobacco: Never  Vaping Use   Vaping Use: Never used   Substance and Sexual Activity   Alcohol use: No    Alcohol/week: 0.0 standard drinks   Drug use: No   Sexual activity: Not on file  Other Topics Concern   Not on file  Social History Narrative   Lives at home with husband, Remo Lipps.   Caffeine use: Drinks coffee/soda (7 cups per week)   Drinks 2 Liters per week   Children live nearby   3 grandchildren, 2 great-grandchildren   Social Determinants of Health   Financial Resource Strain: Low Risk    Difficulty of Paying Living Expenses: Not hard at all  Food Insecurity: No Food Insecurity   Worried About Charity fundraiser in the Last Year: Never true   Arboriculturist in the Last Year: Never true  Transportation Needs: No Transportation Needs   Lack of Transportation (Medical): No   Lack of Transportation (Non-Medical): No  Physical Activity: Insufficiently Active   Days of Exercise per Week: 7 days   Minutes of Exercise per Session: 20 min  Stress: No Stress Concern Present   Feeling of Stress : Not at all  Social Connections: Socially Integrated   Frequency of Communication with Friends and Family: More than three times a week   Frequency of Social Gatherings with Friends and Family: More than three times a week   Attends Religious Services: More than 4 times per year   Active Member of [employer] or Organizations: Yes   Attends Music therapist: More than 4 times per year   Marital Status: Married    Tobacco Counseling Counseling given: Not Answered   Clinical Intake:  Pre-visit preparation completed: Yes  Pain : No/denies pain     BMI - recorded: 28.04 Nutritional Status: BMI 25 -29 Overweight Nutritional Risks: None Diabetes: No  How often do you need to have someone help you when you read instructions, pamphlets, or other written materials from your doctor or pharmacy?: 1 - Never  Diabetic? no  Interpreter Needed?: No  Information entered by :: Sierra Spargo, LPN   Activities of Daily  Living In your present state of health, do you have any difficulty performing the following activities: 02/06/2021 01/14/2021  Hearing? N N  Vision? N N  Difficulty concentrating or making decisions? N N  Walking or climbing stairs? N N  Dressing or bathing? N N  Doing errands, shopping? N N  Preparing Food and eating ? N -  Using the Toilet? N -  In the past six months, have you accidently leaked urine? Y -  Comment mild intermittent -  Do you have problems with loss of bowel control? N -  Managing your Medications? N -  Managing your Finances? N -  Housekeeping or managing your Housekeeping? N -  Some recent data might be hidden    Patient Care Team: Claretta Fraise, MD as PCP - General (Family Medicine)  Indicate any recent Medical Services you may  have received from other than [facility] providers in the past year (date may be approximate).     Assessment:   This is a routine wellness examination for Marcedes.  Hearing/Vision screen Hearing Screening - Comments:: Denies hearing difficulties  Vision Screening - Comments:: Wears rx glasses - Up to date with annual eye exams with [facility] in [place]  Dietary issues and exercise activities discussed: Current Exercise Habits: Home exercise routine, Type of exercise: walking, Time (Minutes): 20, Frequency (Times/Week): 7, Weekly Exercise (Minutes/Week): 140, Intensity: Mild, Exercise limited by: orthopedic condition(s)   Goals Addressed             This Visit's Progress    Patient Stated       Wants to be independent, stay healthy, happy, and spend plenty of time with grandchildren       Depression Screen PHQ 2/9 Scores 02/06/2021 09/04/2020 04/30/2020 11/01/2019 03/14/2019 09/10/2018  PHQ - 2 Score 0 0 0 0 0 0    Fall Risk Fall Risk  02/06/2021 09/04/2020 04/30/2020 11/01/2019 03/14/2019  Falls in the past year? 0 0 0 0 0  Number falls in past yr: 0 - - 0 -  Injury with Fall? 0 - - 0 -  Risk for fall due to :  Orthopedic patient;Other (Comment) - - No Fall Risks -  Risk for fall due to: Comment tremors - - - -  Follow up Falls prevention discussed - - Falls evaluation completed -    FALL RISK PREVENTION PERTAINING TO THE HOME:  Any stairs in or around the home? Yes  If so, are there any without handrails? No  Home free of loose throw rugs in walkways, pet beds, electrical cords, etc? Yes  Adequate lighting in your home to reduce risk of falls? Yes   ASSISTIVE DEVICES UTILIZED TO PREVENT FALLS:  Life alert? No  Use of a cane, walker or w/c? No  Grab bars in the bathroom? No  Shower chair or bench in shower? Yes  Elevated toilet seat or a handicapped toilet? Yes   TIMED UP AND GO:  Was the test performed? No . Telephonic visit  Cognitive Function:     6CIT Screen 02/06/2021  What Year? 0 points  What month? 0 points  What time? 0 points  Count back from 20 0 points  Months in reverse 0 points  Repeat phrase 0 points  Total Score 0    Immunizations Immunization History  Administered Date(s) Administered   Fluad Quad(high Dose 65+) 12/14/2018, 11/04/2019   Influenza Inj Mdck Quad Pf 11/17/2016   Influenza Split 10/03/2014, 11/19/2016   Influenza, High Dose Seasonal PF 01/15/2018   Moderna Sars-Covid-2 Vaccination 03/22/2019, 04/19/2019   Pneumococcal Conjugate-13 01/11/2018   Pneumococcal Polysaccharide-23 04/30/2020   Tdap 02/27/2015    TDAP status: Up to date  Flu Vaccine status: Due, Education has been provided regarding the importance of this vaccine. Advised may receive this vaccine at local pharmacy or Health Dept. Aware to provide a copy of the vaccination record if obtained from local pharmacy or Health Dept. Verbalized acceptance and understanding.  Pneumococcal vaccine status: Up to date  Covid-19 vaccine status: Completed vaccines  Qualifies for Shingles Vaccine? Yes   Zostavax completed No   Shingrix Completed?: No.    Education has been provided  regarding the importance of this vaccine. Patient has been advised to call insurance company to determine out of pocket expense if they have not yet received this vaccine. Advised may also  receive vaccine at local pharmacy or Health Dept. Verbalized acceptance and understanding.  Screening Tests Health Maintenance  Topic Date Due   Zoster Vaccines- Shingrix (1 of 2) Never done   COVID-19 Vaccine (3 - Moderna risk series) 05/17/2019   INFLUENZA VACCINE  09/10/2020   DEXA SCAN  10/31/2021   MAMMOGRAM  07/19/2022   TETANUS/TDAP  02/26/2025   COLONOSCOPY (Pts 45-64yrs Insurance coverage will need to be confirmed)  01/16/2028   Pneumonia Vaccine 65+ Years old  Completed   Hepatitis C Screening  Completed   HPV VACCINES  Aged Out    Health Maintenance  Health Maintenance Due  Topic Date Due   Zoster Vaccines- Shingrix (1 of 2) Never done   COVID-19 Vaccine (3 - Moderna risk series) 05/17/2019   INFLUENZA VACCINE  09/10/2020    Colorectal cancer screening: Type of screening: Colonoscopy. Completed 01/15/2021. Repeat every 7 years  Mammogram status: Completed 07/18/2020. Repeat every year  Bone Density status: Completed 10/31/2020. Results reflect: Bone density results: OSTEOPOROSIS. Repeat every 2 years.  Lung Cancer Screening: (Low Dose CT Chest recommended if Age 74-80 years, 30 pack-year currently smoking OR have quit w/in 15years.) does not qualify.   Additional Screening:  Hepatitis C Screening: does qualify; Completed 04/30/2020  Vision Screening: Recommended annual ophthalmology exams for early detection of glaucoma and other disorders of the eye. Is the patient up to date with their annual eye exam?  Yes  Who is the provider or what is the name of the office in which the patient attends annual eye exams? [facility] If pt is not established with a provider, would they like to be referred to a provider to establish care? No .   Dental Screening: Recommended  annual dental exams for proper oral hygiene  Community Resource Referral / Chronic Care Management: CRR required this visit?  No   CCM required this visit?  No      Plan:     I have personally reviewed and noted the following in the patients chart:   Medical and social history Use of alcohol, tobacco or illicit drugs  Current medications and supplements including opioid prescriptions. Patient is not currently taking opioid prescriptions. Functional ability and status Nutritional status Physical activity Advanced directives List of other physicians Hospitalizations, surgeries, and ER visits in previous 12 months Vitals Screenings to include cognitive, depression, and falls Referrals and appointments  In addition, I have reviewed and discussed with patient certain preventive protocols, quality metrics, and best practice recommendations. A written personalized care plan for preventive services as well as general preventive health recommendations were provided to patient.     Sandrea Hammond, LPN   [id number]   Nurse Notes: None

## 2021-02-06 NOTE — Patient Instructions (Signed)
Rachael Bryant , Thank you for taking time to come for your Medicare Wellness Visit. I appreciate your ongoing commitment to your health goals. Please review the following plan we discussed and let me know if I can assist you in the future.   Screening recommendations/referrals: Colonoscopy: Done 01/15/2021 - Repeat in 7 years Mammogram: Done 07/18/2020 - Repeat annually  Bone Density: Done 11/01/2019 - Repeat every 2 years  Recommended yearly ophthalmology/optometry visit for glaucoma screening and checkup Recommended yearly dental visit for hygiene and checkup  Vaccinations: Influenza vaccine: Done 9/24/221 - Repeat annually *Due Pneumococcal vaccine: Done 01/11/2018 & 04/30/2020 Tdap vaccine: Done 02/27/2015 - Repeat in 10 years  Shingles vaccine: Due   Covid-19:Done 03/22/2019 & 04/19/2019  Advanced directives: Advance directive discussed with you today. Even though you declined this today, please call our office should you change your mind, and we can give you the proper paperwork for you to fill out.   Conditions/risks identified: Aim for 30 minutes of exercise or brisk walking each day, drink 6-8 glasses of water and eat lots of fruits and vegetables.   Next appointment: Follow up in one year for your annual wellness visit    Preventive Care 65 Years and Older, Female Preventive care refers to lifestyle choices and visits with your health care provider that can promote health and wellness. What does preventive care include? A yearly physical exam. This is also called an annual well check. Dental exams once or twice a year. Routine eye exams. Ask your health care provider how often you should have your eyes checked. Personal lifestyle choices, including: Daily care of your teeth and gums. Regular physical activity. Eating a healthy diet. Avoiding tobacco and drug use. Limiting alcohol use. Practicing safe sex. Taking low-dose aspirin every day. Taking vitamin and mineral supplements  as recommended by your health care provider. What happens during an annual well check? The services and screenings done by your health care provider during your annual well check will depend on your age, overall health, lifestyle risk factors, and family history of disease. Counseling  Your health care provider may ask you questions about your: Alcohol use. Tobacco use. Drug use. Emotional well-being. Home and relationship well-being. Sexual activity. Eating habits. History of falls. Memory and ability to understand (cognition). Work and work Statistician. Reproductive health. Screening  You may have the following tests or measurements: Height, weight, and BMI. Blood pressure. Lipid and cholesterol levels. These may be checked every 5 years, or more frequently if you are over 86 years old. Skin check. Lung cancer screening. You may have this screening every year starting at age 83 if you have a 30-pack-year history of smoking and currently smoke or have quit within the past 15 years. Fecal occult blood test (FOBT) of the stool. You may have this test every year starting at age 57. Flexible sigmoidoscopy or colonoscopy. You may have a sigmoidoscopy every 5 years or a colonoscopy every 10 years starting at age 2. Hepatitis C blood test. Hepatitis B blood test. Sexually transmitted disease (STD) testing. Diabetes screening. This is done by checking your blood sugar (glucose) after you have not eaten for a while (fasting). You may have this done every 1-3 years. Bone density scan. This is done to screen for osteoporosis. You may have this done starting at age 73. Mammogram. This may be done every 1-2 years. Talk to your health care provider about how often you should have regular mammograms. Talk with your health care provider about  your test results, treatment options, and if necessary, the need for more tests. Vaccines  Your health care provider may recommend certain vaccines, such  as: Influenza vaccine. This is recommended every year. Tetanus, diphtheria, and acellular pertussis (Tdap, Td) vaccine. You may need a Td booster every 10 years. Zoster vaccine. You may need this after age 85. Pneumococcal 13-valent conjugate (PCV13) vaccine. One dose is recommended after age 62. Pneumococcal polysaccharide (PPSV23) vaccine. One dose is recommended after age 72. Talk to your health care provider about which screenings and vaccines you need and how often you need them. This information is not intended to replace advice given to you by your health care provider. Make sure you discuss any questions you have with your health care provider. Document Released: 02/23/2015 Document Revised: 10/17/2015 Document Reviewed: 11/28/2014 Elsevier Interactive Patient Education  2017 [employer] Prevention in the Home Falls can cause injuries. They can happen to people of all ages. There are many things you can do to make your home safe and to help prevent falls. What can I do on the outside of my home? Regularly fix the edges of walkways and driveways and fix any cracks. Remove anything that might make you trip as you walk through a door, such as a raised step or threshold. Trim any bushes or trees on the path to your home. Use bright outdoor lighting. Clear any walking paths of anything that might make someone trip, such as rocks or tools. Regularly check to see if handrails are loose or broken. Make sure that both sides of any steps have handrails. Any raised decks and porches should have guardrails on the edges. Have any leaves, snow, or ice cleared regularly. Use sand or salt on walking paths during winter. Clean up any spills in your garage right away. This includes oil or grease spills. What can I do in the bathroom? Use night lights. Install grab bars by the toilet and in the tub and shower. Do not use towel bars as grab bars. Use non-skid mats or decals in the tub or  shower. If you need to sit down in the shower, use a plastic, non-slip stool. Keep the floor dry. Clean up any water that spills on the floor as soon as it happens. Remove soap buildup in the tub or shower regularly. Attach bath mats securely with double-sided non-slip rug tape. Do not have throw rugs and other things on the floor that can make you trip. What can I do in the bedroom? Use night lights. Make sure that you have a light by your bed that is easy to reach. Do not use any sheets or blankets that are too big for your bed. They should not hang down onto the floor. Have a firm chair that has side arms. You can use this for support while you get dressed. Do not have throw rugs and other things on the floor that can make you trip. What can I do in the kitchen? Clean up any spills right away. Avoid walking on wet floors. Keep items that you use a lot in easy-to-reach places. If you need to reach something above you, use a strong step stool that has a grab bar. Keep electrical cords out of the way. Do not use floor polish or wax that makes floors slippery. If you must use wax, use non-skid floor wax. Do not have throw rugs and other things on the floor that can make you trip. What can I do with  my stairs? Do not leave any items on the stairs. Make sure that there are handrails on both sides of the stairs and use them. Fix handrails that are broken or loose. Make sure that handrails are as long as the stairways. Check any carpeting to make sure that it is firmly attached to the stairs. Fix any carpet that is loose or worn. Avoid having throw rugs at the top or bottom of the stairs. If you do have throw rugs, attach them to the floor with carpet tape. Make sure that you have a light switch at the top of the stairs and the bottom of the stairs. If you do not have them, ask someone to add them for you. What else can I do to help prevent falls? Wear shoes that: Do not have high heels. Have  rubber bottoms. Are comfortable and fit you well. Are closed at the toe. Do not wear sandals. If you use a stepladder: Make sure that it is fully opened. Do not climb a closed stepladder. Make sure that both sides of the stepladder are locked into place. Ask someone to hold it for you, if possible. Clearly mark and make sure that you can see: Any grab bars or handrails. First and last steps. Where the edge of each step is. Use tools that help you move around (mobility aids) if they are needed. These include: Canes. Walkers. Scooters. Crutches. Turn on the lights when you go into a dark area. Replace any light bulbs as soon as they burn out. Set up your furniture so you have a clear path. Avoid moving your furniture around. If any of your floors are uneven, fix them. If there are any pets around you, be aware of where they are. Review your medicines with your doctor. Some medicines can make you feel dizzy. This can increase your chance of falling. Ask your doctor what other things that you can do to help prevent falls. This information is not intended to replace advice given to you by your health care provider. Make sure you discuss any questions you have with your health care provider. Document Released: 11/23/2008 Document Revised: 07/05/2015 Document Reviewed: 03/03/2014 Elsevier Interactive Patient Education  2017 [employer].

## 2021-02-12 ENCOUNTER — Encounter (INDEPENDENT_AMBULATORY_CARE_PROVIDER_SITE_OTHER): Payer: Medicare Other | Admitting: Ophthalmology

## 2021-02-13 ENCOUNTER — Encounter: Payer: Self-pay | Admitting: Family Medicine

## 2021-02-13 ENCOUNTER — Ambulatory Visit (INDEPENDENT_AMBULATORY_CARE_PROVIDER_SITE_OTHER): Payer: Medicare Other | Admitting: Family Medicine

## 2021-02-13 DIAGNOSIS — G250 Essential tremor: Secondary | ICD-10-CM

## 2021-02-13 DIAGNOSIS — M810 Age-related osteoporosis without current pathological fracture: Secondary | ICD-10-CM | POA: Diagnosis not present

## 2021-02-13 DIAGNOSIS — F411 Generalized anxiety disorder: Secondary | ICD-10-CM

## 2021-02-13 DIAGNOSIS — F339 Major depressive disorder, recurrent, unspecified: Secondary | ICD-10-CM

## 2021-02-13 MED ORDER — SIMVASTATIN 20 MG PO TABS: ORAL_TABLET | ORAL | 0 refills | Status: DC

## 2021-02-13 MED ORDER — ALENDRONATE SODIUM 70 MG PO TABS: 70.0000 mg | ORAL_TABLET | ORAL | 3 refills | Status: DC

## 2021-02-13 MED ORDER — SIMVASTATIN 20 MG PO TABS: ORAL_TABLET | ORAL | 3 refills | Status: DC

## 2021-02-13 MED ORDER — CITALOPRAM HYDROBROMIDE 10 MG PO TABS: 10.0000 mg | ORAL_TABLET | Freq: Every day | ORAL | 3 refills | Status: DC

## 2021-02-13 NOTE — Progress Notes (Signed)
Subjective:  Patient ID: Rachael Bryant, female    DOB: [birth year]  Age: 71 y.o. MRN: [id number]  CC: Medical Management of Chronic Issues   HPI Rachael Bryant presents for follow-up of elevated cholesterol. Doing well without complaints on current medication. Denies side effects of statin including myalgia and arthralgia and nausea. Also in today for liver function testing. Currently no chest pain, shortness of breath or other cardiovascular related symptoms noted. 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   She has a past surgical history that includes Tubal ligation; Colonoscopy with propofol (N/A, 01/15/2021); and polypectomy (01/15/2021).   Her family history includes Diabetes in her mother; Heart attack in her father; Parkinsonism in her sister; Pulmonary fibrosis in her mother.She reports that she has never smoked. She has never used smokeless tobacco. She reports that she does not drink alcohol and does not use drugs.  Current Outpatient Medications on File Prior to Visit  Medication Sig Dispense Refill   Calcium Carbonate (CALCIUM 600 PO) Take by mouth. Takes 3 -4 a day     cyanocobalamin 1000 MCG tablet Take 1,000 mcg by mouth daily.     diphenhydrAMINE (BENADRYL) 25 mg capsule Take 50 mg by mouth at bedtime.     Omega-3 Fatty Acids (FISH OIL) 1200 MG CAPS Take by mouth.     propranolol ER (INDERAL LA) 80 MG 24 hr capsule TAKE 1 CAPSULE BY MOUTH EVERY DAY 90 capsule 3   trimethoprim-polymyxin b (POLYTRIM) ophthalmic solution USE 1 DROP IN RIGHT EYE 4 TIMES DAILY FOR 2 DAYS AFTER EACH MONTHLY EYE INJECTION     Vitamin D, Ergocalciferol, (DRISDOL) 1.25 MG (50000 UNIT) CAPS capsule TAKE 1 CAPSULE (50,000 UNITS TOTAL) BY MOUTH EVERY 7 (SEVEN) DAYS. 13 capsule 3   No current facility-administered medications on file prior to visit.    ROS Review of  Systems  Constitutional: Negative.   HENT: Negative.    Eyes:  Negative for visual disturbance.  Respiratory:  Negative for shortness of breath.   Cardiovascular:  Negative for chest pain.  Gastrointestinal:  Negative for abdominal pain.  Musculoskeletal:  Negative for arthralgias.   Objective:  BP 138/81    Pulse 70    Temp (!) 97.4 F (36.3 C) (Temporal)    Ht 5\' 7"  (1.702 m)    Wt 177 lb 9.6 oz (80.6 kg)    BMI 27.82 kg/m   BP Readings from Last 3 Encounters:  02/13/21 138/81  01/15/21 (!) 94/52  01/14/21 (!) 145/78    Wt Readings from Last 3 Encounters:  02/13/21 177 lb 9.6 oz (80.6 kg)  02/06/21 179 lb (81.2 kg)  01/15/21 179 lb 0.2 oz (81.2 kg)     Physical Exam Constitutional:      General: She is not in acute distress.    Appearance: She is well-developed.  HENT:     Head: Normocephalic and atraumatic.  Eyes:     Conjunctiva/sclera: Conjunctivae normal.     Pupils: Pupils are equal, round, and reactive to light.  Neck:     Thyroid: No thyromegaly.  Cardiovascular:     Rate and Rhythm: Normal rate and regular rhythm.     Heart sounds: Normal heart sounds. No murmur heard. Pulmonary:     Effort: Pulmonary effort is normal. No respiratory distress.     Breath sounds: Normal breath sounds. No wheezing or rales.  Abdominal:  General: Bowel sounds are normal. There is no distension.     Palpations: Abdomen is soft.     Tenderness: There is no abdominal tenderness.  Musculoskeletal:        General: Normal range of motion.     Cervical back: Normal range of motion and neck supple.  Lymphadenopathy:     Cervical: No cervical adenopathy.  Skin:    General: Skin is warm and dry.  Neurological:     Mental Status: She is alert and oriented to person, place, and time.  Psychiatric:        Behavior: Behavior normal.        Thought Content: Thought content normal.        Judgment: Judgment normal.    No results found for: HGBA1C  Lab Results  Component  Value Date   WBC 5.5 04/30/2020   HGB 13.6 04/30/2020   HCT 41.3 04/30/2020   PLT 272 04/30/2020   GLUCOSE 109 (H) 04/30/2020   CHOL 181 04/30/2020   TRIG 115 04/30/2020   HDL 47 04/30/2020   LDLCALC 113 (H) 04/30/2020   ALT 19 04/30/2020   AST 21 04/30/2020   NA 139 04/30/2020   K 4.6 04/30/2020   CL 101 04/30/2020   CREATININE 0.81 04/30/2020   BUN 15 04/30/2020   CO2 24 04/30/2020   TSH 0.519 09/10/2018    No results found.  Assessment & Plan:   Dacie was seen today for medical management of chronic issues.  Diagnoses and all orders for this visit:  Age-related osteoporosis without current pathological fracture -     alendronate (FOSAMAX) 70 MG tablet; Take 1 tablet (70 mg total) by mouth once a week.  Anxiety, generalized -     citalopram (CELEXA) 10 MG tablet; Take 1 tablet (10 mg total) by mouth daily.  Recurrent major depressive disorder, remission status unspecified (HCC) -     citalopram (CELEXA) 10 MG tablet; Take 1 tablet (10 mg total) by mouth daily.  Essential tremor  Other orders -     Discontinue: simvastatin (ZOCOR) 20 MG tablet; TAKE 1 TABLET BY MOUTH EVERYDAY AT BEDTIME (NEEDS TO BE SEEN BEFORE NEXT REFILL) -     simvastatin (ZOCOR) 20 MG tablet; TAKE 1 TABLET BY MOUTH EVERYDAY AT BEDTIME (NEEDS TO BE SEEN BEFORE NEXT REFILL)   I have discontinued Rachael Bryant's methocarbamol. I am also having her maintain her trimethoprim-polymyxin b, diphenhydrAMINE, cyanocobalamin, Fish Oil, propranolol ER, Vitamin D (Ergocalciferol), Calcium Carbonate (CALCIUM 600 PO), citalopram, alendronate, and simvastatin.  Meds ordered this encounter  Medications   citalopram (CELEXA) 10 MG tablet    Sig: Take 1 tablet (10 mg total) by mouth daily.    Dispense:  90 tablet    Refill:  3   DISCONTD: simvastatin (ZOCOR) 20 MG tablet    Sig: TAKE 1 TABLET BY MOUTH EVERYDAY AT BEDTIME (NEEDS TO BE SEEN BEFORE NEXT REFILL)    Dispense:  90 tablet    Refill:  3    alendronate (FOSAMAX) 70 MG tablet    Sig: Take 1 tablet (70 mg total) by mouth once a week.    Dispense:  13 tablet    Refill:  3   simvastatin (ZOCOR) 20 MG tablet    Sig: TAKE 1 TABLET BY MOUTH EVERYDAY AT BEDTIME (NEEDS TO BE SEEN BEFORE NEXT REFILL)    Dispense:  30 tablet    Refill:  0     Follow-up: Return in about 6  months (around 08/13/2021).  Claretta Fraise, M.D.

## 2021-02-19 ENCOUNTER — Encounter (INDEPENDENT_AMBULATORY_CARE_PROVIDER_SITE_OTHER): Payer: Medicare Other | Admitting: Ophthalmology

## 2021-02-19 ENCOUNTER — Other Ambulatory Visit: Payer: Self-pay

## 2021-02-19 DIAGNOSIS — H353121 Nonexudative age-related macular degeneration, left eye, early dry stage: Secondary | ICD-10-CM | POA: Diagnosis not present

## 2021-02-19 DIAGNOSIS — H43813 Vitreous degeneration, bilateral: Secondary | ICD-10-CM

## 2021-02-19 DIAGNOSIS — H353211 Exudative age-related macular degeneration, right eye, with active choroidal neovascularization: Secondary | ICD-10-CM

## 2021-03-26 ENCOUNTER — Encounter (INDEPENDENT_AMBULATORY_CARE_PROVIDER_SITE_OTHER): Payer: Medicare Other | Admitting: Ophthalmology

## 2021-03-26 ENCOUNTER — Other Ambulatory Visit: Payer: Self-pay

## 2021-03-26 DIAGNOSIS — H2513 Age-related nuclear cataract, bilateral: Secondary | ICD-10-CM

## 2021-03-26 DIAGNOSIS — H353121 Nonexudative age-related macular degeneration, left eye, early dry stage: Secondary | ICD-10-CM | POA: Diagnosis not present

## 2021-03-26 DIAGNOSIS — H353211 Exudative age-related macular degeneration, right eye, with active choroidal neovascularization: Secondary | ICD-10-CM

## 2021-03-26 DIAGNOSIS — H43813 Vitreous degeneration, bilateral: Secondary | ICD-10-CM

## 2021-04-30 ENCOUNTER — Other Ambulatory Visit: Payer: Self-pay

## 2021-04-30 ENCOUNTER — Encounter (INDEPENDENT_AMBULATORY_CARE_PROVIDER_SITE_OTHER): Payer: Medicare Other | Admitting: Ophthalmology

## 2021-04-30 DIAGNOSIS — H2513 Age-related nuclear cataract, bilateral: Secondary | ICD-10-CM | POA: Diagnosis not present

## 2021-04-30 DIAGNOSIS — H353121 Nonexudative age-related macular degeneration, left eye, early dry stage: Secondary | ICD-10-CM

## 2021-04-30 DIAGNOSIS — H43813 Vitreous degeneration, bilateral: Secondary | ICD-10-CM | POA: Diagnosis not present

## 2021-04-30 DIAGNOSIS — H353211 Exudative age-related macular degeneration, right eye, with active choroidal neovascularization: Secondary | ICD-10-CM | POA: Diagnosis not present

## 2021-05-28 ENCOUNTER — Encounter (INDEPENDENT_AMBULATORY_CARE_PROVIDER_SITE_OTHER): Payer: Medicare Other | Admitting: Ophthalmology

## 2021-05-28 DIAGNOSIS — H353211 Exudative age-related macular degeneration, right eye, with active choroidal neovascularization: Secondary | ICD-10-CM | POA: Diagnosis not present

## 2021-05-28 DIAGNOSIS — H43813 Vitreous degeneration, bilateral: Secondary | ICD-10-CM | POA: Diagnosis not present

## 2021-05-28 DIAGNOSIS — H353121 Nonexudative age-related macular degeneration, left eye, early dry stage: Secondary | ICD-10-CM | POA: Diagnosis not present

## 2021-06-25 ENCOUNTER — Encounter (INDEPENDENT_AMBULATORY_CARE_PROVIDER_SITE_OTHER): Payer: Medicare Other | Admitting: Ophthalmology

## 2021-06-25 DIAGNOSIS — H353211 Exudative age-related macular degeneration, right eye, with active choroidal neovascularization: Secondary | ICD-10-CM

## 2021-06-25 DIAGNOSIS — H353121 Nonexudative age-related macular degeneration, left eye, early dry stage: Secondary | ICD-10-CM | POA: Diagnosis not present

## 2021-06-25 DIAGNOSIS — H43813 Vitreous degeneration, bilateral: Secondary | ICD-10-CM

## 2021-07-23 ENCOUNTER — Encounter (INDEPENDENT_AMBULATORY_CARE_PROVIDER_SITE_OTHER): Payer: Medicare Other | Admitting: Ophthalmology

## 2021-07-23 DIAGNOSIS — H43813 Vitreous degeneration, bilateral: Secondary | ICD-10-CM

## 2021-07-23 DIAGNOSIS — H353211 Exudative age-related macular degeneration, right eye, with active choroidal neovascularization: Secondary | ICD-10-CM

## 2021-07-23 DIAGNOSIS — H353121 Nonexudative age-related macular degeneration, left eye, early dry stage: Secondary | ICD-10-CM | POA: Diagnosis not present

## 2021-07-25 ENCOUNTER — Other Ambulatory Visit: Payer: Self-pay | Admitting: Family Medicine

## 2021-08-14 ENCOUNTER — Ambulatory Visit: Payer: Medicare Other | Admitting: Family Medicine

## 2021-08-20 ENCOUNTER — Encounter (INDEPENDENT_AMBULATORY_CARE_PROVIDER_SITE_OTHER): Payer: Medicare Other | Admitting: Ophthalmology

## 2021-08-20 DIAGNOSIS — H353121 Nonexudative age-related macular degeneration, left eye, early dry stage: Secondary | ICD-10-CM | POA: Diagnosis not present

## 2021-08-20 DIAGNOSIS — I10 Essential (primary) hypertension: Secondary | ICD-10-CM

## 2021-08-20 DIAGNOSIS — H353211 Exudative age-related macular degeneration, right eye, with active choroidal neovascularization: Secondary | ICD-10-CM

## 2021-08-20 DIAGNOSIS — H43813 Vitreous degeneration, bilateral: Secondary | ICD-10-CM | POA: Diagnosis not present

## 2021-09-17 ENCOUNTER — Encounter (INDEPENDENT_AMBULATORY_CARE_PROVIDER_SITE_OTHER): Payer: Medicare Other | Admitting: Ophthalmology

## 2021-09-17 DIAGNOSIS — H43813 Vitreous degeneration, bilateral: Secondary | ICD-10-CM

## 2021-09-17 DIAGNOSIS — H353121 Nonexudative age-related macular degeneration, left eye, early dry stage: Secondary | ICD-10-CM

## 2021-09-17 DIAGNOSIS — H353211 Exudative age-related macular degeneration, right eye, with active choroidal neovascularization: Secondary | ICD-10-CM

## 2021-10-02 ENCOUNTER — Ambulatory Visit (INDEPENDENT_AMBULATORY_CARE_PROVIDER_SITE_OTHER): Payer: Medicare Other | Admitting: Family Medicine

## 2021-10-02 ENCOUNTER — Encounter: Payer: Self-pay | Admitting: Family Medicine

## 2021-10-02 VITALS — BP 135/80 | HR 65 | Temp 98.2°F | Ht 67.0 in | Wt 178.2 lb

## 2021-10-02 DIAGNOSIS — E559 Vitamin D deficiency, unspecified: Secondary | ICD-10-CM | POA: Diagnosis not present

## 2021-10-02 DIAGNOSIS — E785 Hyperlipidemia, unspecified: Secondary | ICD-10-CM | POA: Diagnosis not present

## 2021-10-02 DIAGNOSIS — Z1283 Encounter for screening for malignant neoplasm of skin: Secondary | ICD-10-CM

## 2021-10-02 DIAGNOSIS — G250 Essential tremor: Secondary | ICD-10-CM | POA: Diagnosis not present

## 2021-10-02 MED ORDER — POLYMYXIN B-TRIMETHOPRIM 10000-0.1 UNIT/ML-% OP SOLN: OPHTHALMIC | 11 refills | Status: DC

## 2021-10-02 MED ORDER — PROPRANOLOL HCL ER 80 MG PO CP24: ORAL_CAPSULE | ORAL | 3 refills | Status: DC

## 2021-10-02 MED ORDER — SIMVASTATIN 20 MG PO TABS: 20.0000 mg | ORAL_TABLET | Freq: Every day | ORAL | 3 refills | Status: DC

## 2021-10-02 MED ORDER — VITAMIN D (ERGOCALCIFEROL) 1.25 MG (50000 UNIT) PO CAPS: 50000.0000 [IU] | ORAL_CAPSULE | ORAL | 3 refills | Status: DC

## 2021-10-02 NOTE — Progress Notes (Signed)
Subjective:  Patient ID: Rachael Bryant, female    DOB: 05-19-1950  Age: 71 y.o. MRN: [id number]  CC: Medical Management of Chronic Issues   HPI Rachael Bryant presents for follow-up of elevated cholesterol. Doing well without complaints on current medication. Denies side effects of statin including myalgia and arthralgia and nausea. Also in today for liver function testing. Currently no chest pain, shortness of breath or other cardiovascular related symptoms noted.  Essential tremor ontrolled with inderal. Taking fosamax weekly. DEXA due next month.   Has had multiple skin lesions removed over time. Due skin survey, but her dermatologist has moved away.Requesting referral. 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   She has a past surgical history that includes Tubal ligation; Colonoscopy with propofol (N/A, 01/15/2021); and polypectomy (01/15/2021).   Her family history includes Diabetes in her mother; Heart attack in her father; Parkinsonism in her sister; Pulmonary fibrosis in her mother.She reports that she has never smoked. She has never used smokeless tobacco. She reports that she does not drink alcohol and does not use drugs.  Current Outpatient Medications on File Prior to Visit  Medication Sig Dispense Refill   alendronate (FOSAMAX) 70 MG tablet Take 1 tablet (70 mg total) by mouth once a week. 13 tablet 3   Calcium Carbonate (CALCIUM 600 PO) Take by mouth. Takes 3 -4 a day     citalopram (CELEXA) 10 MG tablet Take 1 tablet (10 mg total) by mouth daily. 90 tablet 3   cyanocobalamin 1000 MCG tablet Take 1,000 mcg by mouth daily.     diphenhydrAMINE (BENADRYL) 25 mg capsule Take 50 mg by mouth at bedtime.     Omega-3 Fatty Acids (FISH OIL) 1200 MG CAPS Take by mouth.     No current facility-administered medications on file prior to visit.     ROS Review of Systems  Constitutional: Negative.   HENT: Negative.    Eyes:  Negative for visual disturbance.  Respiratory:  Negative for shortness of breath.   Cardiovascular:  Negative for chest pain.  Gastrointestinal:  Negative for abdominal pain.  Musculoskeletal:  Negative for arthralgias.    Objective:  BP 135/80   Pulse 65   Temp 98.2 F (36.8 C)   Ht '5\' 7"'  (1.702 m)   Wt 178 lb 3.2 oz (80.8 kg)   SpO2 96%   BMI 27.91 kg/m   BP Readings from Last 3 Encounters:  10/02/21 135/80  02/13/21 138/81  01/15/21 (!) 94/52    Wt Readings from Last 3 Encounters:  10/02/21 178 lb 3.2 oz (80.8 kg)  02/13/21 177 lb 9.6 oz (80.6 kg)  02/06/21 179 lb (81.2 kg)     Physical Exam Constitutional:      General: She is not in acute distress.    Appearance: She is well-developed.  Cardiovascular:     Rate and Rhythm: Normal rate and regular rhythm.  Pulmonary:     Breath sounds: Normal breath sounds.  Musculoskeletal:        General: Normal range of motion.  Skin:    General: Skin is warm and dry.  Neurological:     Mental Status: She is alert and oriented to person, place, and time.     No results found for: "HGBA1C"  Lab Results  Component Value Date   WBC 5.5 04/30/2020   HGB 13.6 04/30/2020   HCT 41.3  04/30/2020   PLT 272 04/30/2020   GLUCOSE 109 (H) 04/30/2020   CHOL 181 04/30/2020   TRIG 115 04/30/2020   HDL 47 04/30/2020   LDLCALC 113 (H) 04/30/2020   ALT 19 04/30/2020   AST 21 04/30/2020   NA 139 04/30/2020   K 4.6 04/30/2020   CL 101 04/30/2020   CREATININE 0.81 04/30/2020   BUN 15 04/30/2020   CO2 24 04/30/2020   TSH 0.519 09/10/2018    No results found.  Assessment & Plan:   Rachael Bryant was seen today for medical management of chronic issues.  Diagnoses and all orders for this visit:  Dyslipidemia -     CBC with Differential/Platelet -     CMP14+EGFR -     Lipid panel  Essential tremor -     propranolol ER (INDERAL LA) 80 MG 24 hr  capsule; TAKE 1 CAPSULE BY MOUTH EVERY DAY -     CBC with Differential/Platelet -     CMP14+EGFR  Vitamin D deficiency -     CBC with Differential/Platelet -     CMP14+EGFR -     VITAMIN D 25 Hydroxy (Vit-D Deficiency, Fractures)  Skin cancer screening -     Ambulatory referral to Dermatology  Other orders -     simvastatin (ZOCOR) 20 MG tablet; Take 1 tablet (20 mg total) by mouth at bedtime. -     Vitamin D, Ergocalciferol, (DRISDOL) 1.25 MG (50000 UNIT) CAPS capsule; Take 1 capsule (50,000 Units total) by mouth every 7 (seven) days. -     trimethoprim-polymyxin b (POLYTRIM) ophthalmic solution; USE 1 DROP IN RIGHT EYE 4 TIMES DAILY FOR 2 DAYS AFTER EACH MONTHLY EYE INJECTION   I am having Fredrich Romans maintain her diphenhydrAMINE, cyanocobalamin, Fish Oil, Calcium Carbonate (CALCIUM 600 PO), citalopram, alendronate, propranolol ER, simvastatin, Vitamin D (Ergocalciferol), and trimethoprim-polymyxin b.  Meds ordered this encounter  Medications   propranolol ER (INDERAL LA) 80 MG 24 hr capsule    Sig: TAKE 1 CAPSULE BY MOUTH EVERY DAY    Dispense:  90 capsule    Refill:  3   simvastatin (ZOCOR) 20 MG tablet    Sig: Take 1 tablet (20 mg total) by mouth at bedtime.    Dispense:  90 tablet    Refill:  3   Vitamin D, Ergocalciferol, (DRISDOL) 1.25 MG (50000 UNIT) CAPS capsule    Sig: Take 1 capsule (50,000 Units total) by mouth every 7 (seven) days.    Dispense:  13 capsule    Refill:  3   trimethoprim-polymyxin b (POLYTRIM) ophthalmic solution    Sig: USE 1 DROP IN RIGHT EYE 4 TIMES DAILY FOR 2 DAYS AFTER EACH MONTHLY EYE INJECTION    Dispense:  10 mL    Refill:  11     Follow-up: Return in about 6 months (around 04/04/2022).  Claretta Fraise, M.D.

## 2021-10-04 ENCOUNTER — Other Ambulatory Visit: Payer: Medicare Other

## 2021-10-04 DIAGNOSIS — E785 Hyperlipidemia, unspecified: Secondary | ICD-10-CM | POA: Diagnosis not present

## 2021-10-04 DIAGNOSIS — G250 Essential tremor: Secondary | ICD-10-CM | POA: Diagnosis not present

## 2021-10-04 DIAGNOSIS — E559 Vitamin D deficiency, unspecified: Secondary | ICD-10-CM | POA: Diagnosis not present

## 2021-10-05 LAB — CMP14+EGFR
ALT: 14 IU/L (ref 0–32)
AST: 17 IU/L (ref 0–40)
Albumin/Globulin Ratio: 1.7 (ref 1.2–2.2)
Albumin: 4.3 g/dL (ref 3.9–4.9)
Alkaline Phosphatase: 109 IU/L (ref 44–121)
BUN/Creatinine Ratio: 21 (ref 12–28)
BUN: 16 mg/dL (ref 8–27)
Bilirubin Total: 0.4 mg/dL (ref 0.0–1.2)
CO2: 23 mmol/L (ref 20–29)
Calcium: 9.4 mg/dL (ref 8.7–10.3)
Chloride: 102 mmol/L (ref 96–106)
Creatinine, Ser: 0.78 mg/dL (ref 0.57–1.00)
Globulin, Total: 2.5 g/dL (ref 1.5–4.5)
Glucose: 108 mg/dL — ABNORMAL HIGH (ref 70–99)
Potassium: 4.3 mmol/L (ref 3.5–5.2)
Sodium: 140 mmol/L (ref 134–144)
Total Protein: 6.8 g/dL (ref 6.0–8.5)
eGFR: 82 mL/min/{1.73_m2} (ref >59)

## 2021-10-05 LAB — CBC WITH DIFFERENTIAL/PLATELET
Basophils Absolute: 0 10*3/uL (ref 0.0–0.2)
Basos: 1 %
EOS (ABSOLUTE): 0.2 10*3/uL (ref 0.0–0.4)
Eos: 3 %
Hematocrit: 39.7 % (ref 34.0–46.6)
Hemoglobin: 13.2 g/dL (ref 11.1–15.9)
Immature Grans (Abs): 0 10*3/uL (ref 0.0–0.1)
Immature Granulocytes: 0 %
Lymphocytes Absolute: 1.8 10*3/uL (ref 0.7–3.1)
Lymphs: 30 %
MCH: 29.6 pg (ref 26.6–33.0)
MCHC: 33.2 g/dL (ref 31.5–35.7)
MCV: 89 fL (ref 79–97)
Monocytes Absolute: 0.5 10*3/uL (ref 0.1–0.9)
Monocytes: 8 %
Neutrophils Absolute: 3.5 10*3/uL (ref 1.4–7.0)
Neutrophils: 58 %
Platelets: 271 10*3/uL (ref 150–450)
RBC: 4.46 x10E6/uL (ref 3.77–5.28)
RDW: 13 % (ref 11.7–15.4)
WBC: 6.1 10*3/uL (ref 3.4–10.8)

## 2021-10-05 LAB — LIPID PANEL
Chol/HDL Ratio: 4 ratio (ref 0.0–4.4)
Cholesterol, Total: 178 mg/dL (ref 100–199)
HDL: 44 mg/dL (ref >39)
LDL Chol Calc (NIH): 109 mg/dL — ABNORMAL HIGH (ref 0–99)
Triglycerides: 142 mg/dL (ref 0–149)
VLDL Cholesterol Cal: 25 mg/dL (ref 5–40)

## 2021-10-05 LAB — VITAMIN D 25 HYDROXY (VIT D DEFICIENCY, FRACTURES): Vit D, 25-Hydroxy: 42.2 ng/mL (ref 30.0–100.0)

## 2021-10-07 DIAGNOSIS — Z1231 Encounter for screening mammogram for malignant neoplasm of breast: Secondary | ICD-10-CM | POA: Diagnosis not present

## 2021-10-07 NOTE — Progress Notes (Signed)
Hello Kynnedi,  Your lab result is normal and/or stable.Some minor variations that are not significant are commonly marked abnormal, but do not represent any medical problem for you.  Best regards, Claretta Fraise, M.D.

## 2021-10-15 ENCOUNTER — Encounter (INDEPENDENT_AMBULATORY_CARE_PROVIDER_SITE_OTHER): Payer: Medicare Other | Admitting: Ophthalmology

## 2021-10-15 DIAGNOSIS — H43813 Vitreous degeneration, bilateral: Secondary | ICD-10-CM | POA: Diagnosis not present

## 2021-10-15 DIAGNOSIS — H353121 Nonexudative age-related macular degeneration, left eye, early dry stage: Secondary | ICD-10-CM | POA: Diagnosis not present

## 2021-10-15 DIAGNOSIS — H353211 Exudative age-related macular degeneration, right eye, with active choroidal neovascularization: Secondary | ICD-10-CM | POA: Diagnosis not present

## 2021-11-11 ENCOUNTER — Encounter (INDEPENDENT_AMBULATORY_CARE_PROVIDER_SITE_OTHER): Payer: Medicare Other | Admitting: Ophthalmology

## 2021-11-11 DIAGNOSIS — H353211 Exudative age-related macular degeneration, right eye, with active choroidal neovascularization: Secondary | ICD-10-CM

## 2021-11-11 DIAGNOSIS — H43813 Vitreous degeneration, bilateral: Secondary | ICD-10-CM

## 2021-11-11 DIAGNOSIS — H353121 Nonexudative age-related macular degeneration, left eye, early dry stage: Secondary | ICD-10-CM

## 2021-11-20 ENCOUNTER — Encounter: Payer: Self-pay | Admitting: Nurse Practitioner

## 2021-11-20 ENCOUNTER — Telehealth: Payer: Self-pay | Admitting: Family Medicine

## 2021-11-20 ENCOUNTER — Ambulatory Visit (INDEPENDENT_AMBULATORY_CARE_PROVIDER_SITE_OTHER): Payer: Medicare Other | Admitting: Nurse Practitioner

## 2021-11-20 DIAGNOSIS — J069 Acute upper respiratory infection, unspecified: Secondary | ICD-10-CM | POA: Diagnosis not present

## 2021-11-20 MED ORDER — BENZONATATE 100 MG PO CAPS: 100.0000 mg | ORAL_CAPSULE | Freq: Three times a day (TID) | ORAL | 0 refills | Status: DC | PRN

## 2021-11-20 MED ORDER — GUAIFENESIN ER 600 MG PO TB12: 600.0000 mg | ORAL_TABLET | Freq: Two times a day (BID) | ORAL | 0 refills | Status: DC

## 2021-11-20 NOTE — Progress Notes (Signed)
   Virtual Visit  Note Due to COVID-19 pandemic this visit was conducted virtually. This visit type was conducted due to national recommendations for restrictions regarding the COVID-19 Pandemic (e.g. social distancing, sheltering in place) in an effort to limit this patient's exposure and mitigate transmission in our community. All issues noted in this document were discussed and addressed.  A physical exam was not performed with this format.  I connected with JOELEE Bryant on 11/20/21 at 3:20 pm  by telephone and verified that I am speaking with the correct person using two identifiers. Rachael Bryant is currently located at home during visit. The provider, Ivy Lynn, NP is located in their office at time of visit.  I discussed the limitations, risks, security and privacy concerns of performing an evaluation and management service by telephone and the availability of in person appointments. I also discussed with the patient that there may be a patient responsible charge related to this service. The patient expressed understanding and agreed to proceed.   History and Present Illness:  URI  This is a new problem. The current episode started yesterday. The problem has been unchanged. There has been no fever. Associated symptoms include congestion and coughing. Pertinent negatives include no headaches, nausea, sinus pain, sneezing or sore throat. She has tried nothing for the symptoms.      Review of Systems  HENT:  Positive for congestion. Negative for sinus pain, sneezing and sore throat.   Respiratory:  Positive for cough.   Gastrointestinal:  Negative for nausea.  Neurological:  Negative for headaches.     Observations/Objective: Televisit patient not in distress  Assessment and Plan: Patient presents with cough and congestion, patient denies fever, body ache and headache. Take meds as prescribed - Use a cool mist humidifier  -Use saline nose sprays frequently -Force  fluids -For fever or aches or pains- take Tylenol or ibuprofen. -If symptoms do not improve, she may need to be COVID tested to rule this out   Follow Up Instructions: Follow-up with unresolved symptoms    I discussed the assessment and treatment plan with the patient. The patient was provided an opportunity to ask questions and all were answered. The patient agreed with the plan and demonstrated an understanding of the instructions.   The patient was advised to call back or seek an in-person evaluation if the symptoms worsen or if the condition fails to improve as anticipated.  The above assessment and management plan was discussed with the patient. The patient verbalized understanding of and has agreed to the management plan. Patient is aware to call the clinic if symptoms persist or worsen. Patient is aware when to return to the clinic for a follow-up visit. Patient educated on when it is appropriate to go to the emergency department.   Time call ended:  3:29 pm   I provided 8 minutes of  non face-to-face time during this encounter.    Ivy Lynn, NP

## 2021-11-20 NOTE — Telephone Encounter (Signed)
Patient took at home COVID test and she said that it came back neg.

## 2021-11-20 NOTE — Telephone Encounter (Signed)
Okay thank you, I sent her some medication already to take care of her symptoms

## 2021-12-09 ENCOUNTER — Encounter (INDEPENDENT_AMBULATORY_CARE_PROVIDER_SITE_OTHER): Payer: Medicare Other | Admitting: Ophthalmology

## 2021-12-09 DIAGNOSIS — H353121 Nonexudative age-related macular degeneration, left eye, early dry stage: Secondary | ICD-10-CM

## 2021-12-09 DIAGNOSIS — H43813 Vitreous degeneration, bilateral: Secondary | ICD-10-CM

## 2021-12-09 DIAGNOSIS — H353211 Exudative age-related macular degeneration, right eye, with active choroidal neovascularization: Secondary | ICD-10-CM

## 2021-12-31 DIAGNOSIS — Z85828 Personal history of other malignant neoplasm of skin: Secondary | ICD-10-CM | POA: Diagnosis not present

## 2021-12-31 DIAGNOSIS — L821 Other seborrheic keratosis: Secondary | ICD-10-CM | POA: Diagnosis not present

## 2021-12-31 DIAGNOSIS — L918 Other hypertrophic disorders of the skin: Secondary | ICD-10-CM | POA: Diagnosis not present

## 2021-12-31 DIAGNOSIS — Z08 Encounter for follow-up examination after completed treatment for malignant neoplasm: Secondary | ICD-10-CM | POA: Diagnosis not present

## 2022-01-06 ENCOUNTER — Encounter (INDEPENDENT_AMBULATORY_CARE_PROVIDER_SITE_OTHER): Payer: Medicare Other | Admitting: Ophthalmology

## 2022-01-06 DIAGNOSIS — H353121 Nonexudative age-related macular degeneration, left eye, early dry stage: Secondary | ICD-10-CM

## 2022-01-06 DIAGNOSIS — H43813 Vitreous degeneration, bilateral: Secondary | ICD-10-CM | POA: Diagnosis not present

## 2022-01-06 DIAGNOSIS — H353211 Exudative age-related macular degeneration, right eye, with active choroidal neovascularization: Secondary | ICD-10-CM | POA: Diagnosis not present

## 2022-01-17 ENCOUNTER — Encounter: Payer: Self-pay | Admitting: Family Medicine

## 2022-01-17 ENCOUNTER — Ambulatory Visit (INDEPENDENT_AMBULATORY_CARE_PROVIDER_SITE_OTHER): Payer: Medicare Other | Admitting: Family Medicine

## 2022-01-17 DIAGNOSIS — J40 Bronchitis, not specified as acute or chronic: Secondary | ICD-10-CM | POA: Diagnosis not present

## 2022-01-17 MED ORDER — ALBUTEROL SULFATE HFA 108 (90 BASE) MCG/ACT IN AERS: 2.0000 | INHALATION_SPRAY | Freq: Four times a day (QID) | RESPIRATORY_TRACT | 2 refills | Status: AC | PRN

## 2022-01-17 MED ORDER — AMOXICILLIN-POT CLAVULANATE 875-125 MG PO TABS
1.0000 | ORAL_TABLET | Freq: Two times a day (BID) | ORAL | 0 refills | Status: AC
Start: 2022-01-17 — End: 2022-01-24

## 2022-01-17 NOTE — Progress Notes (Signed)
Virtual Visit  Note Due to COVID-19 pandemic this visit was conducted virtually. This visit type was conducted due to national recommendations for restrictions regarding the COVID-19 Pandemic (e.g. social distancing, sheltering in place) in an effort to limit this patient's exposure and mitigate transmission in our community. All issues noted in this document were discussed and addressed.  A physical exam was not performed with this format.  I connected with Rachael Bryant on 01/17/22 at 12:46 by telephone and verified that I am speaking with the correct person using two identifiers. Rachael Bryant is currently located at home and no one is currently with her during the visit. The provider, Gwenlyn Perking, FNP is located in their office at time of visit.  I discussed the limitations, risks, security and privacy concerns of performing an evaluation and management service by telephone and the availability of in person appointments. I also discussed with the patient that there may be a patient responsible charge related to this service. The patient expressed understanding and agreed to proceed.  CC: cough  History and Present Illness:  Cough This is a new problem. Episode onset: 8 days. The problem has been gradually worsening. The problem occurs every few minutes. The cough is Productive of sputum (clear). Associated symptoms include rhinorrhea and wheezing (coughing fit). Pertinent negatives include no chest pain, chills, fever, headaches, hemoptysis, myalgias, nasal congestion, postnasal drip, sore throat or shortness of breath. Risk factors for lung disease include smoking/tobacco exposure (as a child). She has tried OTC cough suppressant (mucinex, tessalon perles) for the symptoms. The treatment provided mild relief. Her past medical history is significant for bronchitis. There is no history of asthma, COPD or pneumonia.      Review of Systems  Constitutional:  Negative for chills  and fever.  HENT:  Positive for rhinorrhea. Negative for postnasal drip and sore throat.   Respiratory:  Positive for cough and wheezing (coughing fit). Negative for hemoptysis and shortness of breath.   Cardiovascular:  Negative for chest pain.  Musculoskeletal:  Negative for myalgias.  Neurological:  Negative for headaches.     Observations/Objective: Alert and oriented x 3. Able to speak in full sentences without difficulty.    Assessment and Plan: [name] was seen today for cough.  Diagnoses and all orders for this visit:  Bronchitis Augmentin as below. Albuterol prn. Discussed symptomatic care and return precautions.  -     amoxicillin-clavulanate (AUGMENTIN) 875-125 MG tablet; Take 1 tablet by mouth 2 (two) times daily for 7 days. -     albuterol (VENTOLIN HFA) 108 (90 Base) MCG/ACT inhaler; Inhale 2 puffs into the lungs every 6 (six) hours as needed for wheezing or shortness of breath.     Follow Up Instructions: As needed.     I discussed the assessment and treatment plan with the patient. The patient was provided an opportunity to ask questions and all were answered. The patient agreed with the plan and demonstrated an understanding of the instructions.   The patient was advised to call back or seek an in-person evaluation if the symptoms worsen or if the condition fails to improve as anticipated.  The above assessment and management plan was discussed with the patient. The patient verbalized understanding of and has agreed to the management plan. Patient is aware to call the clinic if symptoms persist or worsen. Patient is aware when to return to the clinic for a follow-up visit. Patient educated on when it is appropriate to go to  the emergency department.   Time call ended:  1257  I provided 11 minutes of  non face-to-face time during this encounter.    Gwenlyn Perking, FNP

## 2022-02-07 ENCOUNTER — Encounter: Payer: Self-pay | Admitting: Family Medicine

## 2022-02-07 ENCOUNTER — Ambulatory Visit (INDEPENDENT_AMBULATORY_CARE_PROVIDER_SITE_OTHER): Payer: Medicare Other

## 2022-02-07 VITALS — Ht 67.0 in | Wt 174.0 lb

## 2022-02-07 DIAGNOSIS — Z0000 Encounter for general adult medical examination without abnormal findings: Secondary | ICD-10-CM | POA: Diagnosis not present

## 2022-02-07 DIAGNOSIS — Z1231 Encounter for screening mammogram for malignant neoplasm of breast: Secondary | ICD-10-CM

## 2022-02-07 NOTE — Progress Notes (Signed)
Subjective:   Rachael Bryant is a 71 y.o. female who presents for Medicare Annual (Subsequent) preventive examination. I connected with  ANALAURA MESSLER on 02/07/22 by a audio enabled telemedicine application and verified that I am speaking with the correct person using two identifiers.  Patient Location: Home  Provider Location: Home Office  I discussed the limitations of evaluation and management by telemedicine. The patient expressed understanding and agreed to proceed.  Review of Systems     Cardiac Risk Factors include: advanced age (>65mn, >>53women)     Objective:    Today's Vitals   02/07/22 0816  Weight: 174 lb (78.9 kg)  Height: '5\' 7"'$  (1.702 m)   Body mass index is 27.25 kg/m.     02/07/2022    8:19 AM 02/06/2021    3:31 PM 01/15/2021    9:51 AM 01/14/2021   10:18 AM  Advanced Directives  Does Patient Have a Medical Advance Directive? No No No No  Would patient like information on creating a medical advance directive? No - Patient declined No - Patient declined  No - Patient declined    Current Medications (verified) Outpatient Encounter Medications as of 02/07/2022  Medication Sig   albuterol (VENTOLIN HFA) 108 (90 Base) MCG/ACT inhaler Inhale 2 puffs into the lungs every 6 (six) hours as needed for wheezing or shortness of breath.   alendronate (FOSAMAX) 70 MG tablet Take 1 tablet (70 mg total) by mouth once a week.   benzonatate (TESSALON PERLES) 100 MG capsule Take 1 capsule (100 mg total) by mouth 3 (three) times daily as needed.   Calcium Carbonate (CALCIUM 600 PO) Take by mouth. Takes 3 -4 a day   citalopram (CELEXA) 10 MG tablet Take 1 tablet (10 mg total) by mouth daily.   cyanocobalamin 1000 MCG tablet Take 1,000 mcg by mouth daily.   diphenhydrAMINE (BENADRYL) 25 mg capsule Take 50 mg by mouth at bedtime.   guaiFENesin (MUCINEX) 600 MG 12 hr tablet Take 1 tablet (600 mg total) by mouth 2 (two) times daily.   Omega-3 Fatty Acids (FISH OIL)  1200 MG CAPS Take by mouth.   propranolol ER (INDERAL LA) 80 MG 24 hr capsule TAKE 1 CAPSULE BY MOUTH EVERY DAY   simvastatin (ZOCOR) 20 MG tablet Take 1 tablet (20 mg total) by mouth at bedtime.   trimethoprim-polymyxin b (POLYTRIM) ophthalmic solution USE 1 DROP IN RIGHT EYE 4 TIMES DAILY FOR 2 DAYS AFTER EACH MONTHLY EYE INJECTION   Vitamin D, Ergocalciferol, (DRISDOL) 1.25 MG (50000 UNIT) CAPS capsule Take 1 capsule (50,000 Units total) by mouth every 7 (seven) days.   No facility-administered encounter medications on file as of 02/07/2022.    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   Left Alar Crease   PONV (postoperative nausea and vomiting)    Past Surgical History:  Procedure Laterality Date   COLONOSCOPY WITH PROPOFOL N/A 01/15/2021   Procedure: COLONOSCOPY WITH PROPOFOL;  Surgeon: CHarvel Quale MD;  [facility]: [facility];  Service: Gastroenterology;  Laterality: N/A;  11:15   POLYPECTOMY  01/15/2021   Procedure: POLYPECTOMY;  Surgeon: CHarvel Quale MD;  Location: [facility];  Service: Gastroenterology;;   TUBAL LIGATION     Family History  Problem Relation Age of Onset   Heart attack Father  Diabetes Mother    Pulmonary fibrosis Mother    Parkinsonism Sister    Social History   Socioeconomic History   Marital status: Married    Spouse name: Remo Lipps    Number of children: 2   Years of education: 12   Highest education level: Not on file  Occupational History   Occupation: retired  Tobacco Use   Smoking status: Never   Smokeless tobacco: Never  Vaping Use   Vaping Use: Never used  Substance and Sexual Activity   Alcohol use: No    Alcohol/week: 0.0 standard drinks of alcohol   Drug use: No   Sexual activity: Not on file  Other Topics Concern   Not on file  Social  History Narrative   Lives at home with husband, Remo Lipps.   Caffeine use: Drinks coffee/soda (7 cups per week)   Drinks 2 Liters per week   Children live nearby   3 grandchildren, 2 great-grandchildren   Social Determinants of Health   Financial Resource Strain: Low Risk  (02/07/2022)   Overall Financial Resource Strain (CARDIA)    Difficulty of Paying Living Expenses: Not hard at all  Food Insecurity: No Food Insecurity (02/07/2022)   Hunger Vital Sign    Worried About Running Out of Food in the Last Year: Never true    Ran Out of Food in the Last Year: Never true  Transportation Needs: No Transportation Needs (02/07/2022)   PRAPARE - Hydrologist (Medical): No    Lack of Transportation (Non-Medical): No  Physical Activity: Sufficiently Active (02/07/2022)   Exercise Vital Sign    Days of Exercise per Week: 5 days    Minutes of Exercise per Session: 30 min  Stress: No Stress Concern Present (02/07/2022)   [place]    Feeling of Stress : Not at all  Social Connections: Moderately Integrated (02/07/2022)   Social Connection and Isolation Panel [NHANES]    Frequency of Communication with Friends and Family: More than three times a week    Frequency of Social Gatherings with Friends and Family: More than three times a week    Attends Religious Services: More than 4 times per year    Active Member of [employer] or Organizations: No    Attends Music therapist: Never    Marital Status: Married    Tobacco Counseling Counseling given: Not Answered   Clinical Intake:  Pre-visit preparation completed: Yes  Pain : No/denies pain     Nutritional Risks: None Diabetes: No  How often do you need to have someone help you when you read instructions, pamphlets, or other written materials from your doctor or pharmacy?: 1 - Never  Diabetic?no   Interpreter Needed?:  No  Information entered by :: Jadene Pierini, LPN   Activities of Daily Living    02/07/2022    8:19 AM  In your present state of health, do you have any difficulty performing the following activities:  Hearing? 0  Vision? 0  Difficulty concentrating or making decisions? 0  Walking or climbing stairs? 0  Dressing or bathing? 0  Doing errands, shopping? 0  Preparing Food and eating ? N  Using the Toilet? N  In the past six months, have you accidently leaked urine? N  Do you have problems with loss of bowel control? N  Managing your Medications? N  Managing your Finances? N  Housekeeping or managing your Housekeeping? N  Patient Care Team: Claretta Fraise, MD as PCP - General (Family Medicine)  Indicate any recent Medical Services you may have received from other than [facility] providers in the past year (date may be approximate).     Assessment:   This is a routine wellness examination for Samirah.  Hearing/Vision screen Vision Screening - Comments:: Wears rx glasses - up to date with routine eye exams with  Dr.Matthews  Dietary issues and exercise activities discussed: Current Exercise Habits: Home exercise routine, Type of exercise: walking, Time (Minutes): 30, Frequency (Times/Week): 3, Weekly Exercise (Minutes/Week): 90, Intensity: Mild, Exercise limited by: None identified   Goals Addressed             This Visit's Progress    Patient Stated   On track    Wants to be independent, stay healthy, happy, and spend plenty of time with grandchildren       Depression Screen    02/07/2022    8:18 AM 10/02/2021    2:25 PM 02/13/2021    2:58 PM 02/06/2021    3:30 PM 09/04/2020    8:12 AM 04/30/2020    7:52 AM 11/01/2019    4:04 PM  PHQ 2/9 Scores  PHQ - 2 Score 0 0 0 0 0 0 0  PHQ- 9 Score   0        Fall Risk    02/07/2022    8:16 AM 10/02/2021    2:25 PM 02/13/2021    2:58 PM 02/06/2021    3:36 PM 09/04/2020    8:12 AM  [place] in the past year? 0 0 0 0  0  Number falls in past yr: 0   0   Injury with Fall? 0   0   Risk for fall due to : No Fall Risks   Orthopedic patient;Other (Comment)   Risk for fall due to: Comment    tremors   Follow up Falls prevention discussed   Falls prevention discussed     FALL RISK PREVENTION PERTAINING TO THE HOME:  Any stairs in or around the home? Yes  If so, are there any without handrails? No  Home free of loose throw rugs in walkways, pet beds, electrical cords, etc? Yes  Adequate lighting in your home to reduce risk of falls? Yes   ASSISTIVE DEVICES UTILIZED TO PREVENT FALLS:  Life alert? No  Use of a cane, walker or w/c? No  Grab bars in the bathroom? Yes  Shower chair or bench in shower? Yes  Elevated toilet seat or a handicapped toilet? No       02/07/2022    8:19 AM 02/06/2021    3:38 PM  6CIT Screen  What Year? 0 points 0 points  What month? 0 points 0 points  What time? 0 points 0 points  Count back from 20 0 points 0 points  Months in reverse 0 points 0 points  Repeat phrase 0 points 0 points  Total Score 0 points 0 points    Immunizations Immunization History  Administered Date(s) Administered   Fluad Quad(high Dose 65+) 12/14/2018, 11/04/2019   Influenza Inj Mdck Quad Pf 11/17/2016   Influenza Split 10/03/2014, 11/19/2016   Influenza, High Dose Seasonal PF 01/15/2018   Moderna Sars-Covid-2 Vaccination 03/22/2019, 04/19/2019   Pneumococcal Conjugate-13 01/11/2018   Pneumococcal Polysaccharide-23 04/30/2020   Tdap 02/27/2015    TDAP status: Up to date  Flu Vaccine status: Due, Education has been provided regarding the importance of this  vaccine. Advised may receive this vaccine at local pharmacy or Health Dept. Aware to provide a copy of the vaccination record if obtained from local pharmacy or Health Dept. Verbalized acceptance and understanding.  Pneumococcal vaccine status: Up to date  Covid-19 vaccine status: Completed vaccines  Qualifies for Shingles Vaccine?  Yes   Zostavax completed No   Shingrix Completed?: Yes  Screening Tests Health Maintenance  Topic Date Due   Zoster Vaccines- Shingrix (1 of 2) Never done   COVID-19 Vaccine (3 - Moderna risk series) 05/17/2019   MAMMOGRAM  07/18/2021   INFLUENZA VACCINE  05/11/2022 (Originally 09/10/2021)   DEXA SCAN  02/01/2023 (Originally 10/31/2021)   Medicare Annual Wellness (AWV)  02/08/2023   DTaP/Tdap/Td (2 - Td or Tdap) 02/26/2025   COLONOSCOPY (Pts 45-45yr Insurance coverage will need to be confirmed)  01/16/2028   Pneumonia Vaccine 71 Years old  Completed   Hepatitis C Screening  Completed   HPV VACCINES  Aged Out    Health Maintenance  Health Maintenance Due  Topic Date Due   Zoster Vaccines- Shingrix (1 of 2) Never done   COVID-19 Vaccine (3 - Moderna risk series) 05/17/2019   MAMMOGRAM  07/18/2021    Colorectal cancer screening: Type of screening: Colonoscopy. Completed 01/15/2021. Repeat every 7 years  Mammogram status: Ordered 02/07/2022. Pt provided with contact info and advised to call to schedule appt.   Bone Density status: Completed 11/01/2019. Results reflect: Bone density results: OSTEOPENIA. Repeat every 5 years.  Lung Cancer Screening: (Low Dose CT Chest recommended if Age 69-80years, 30 pack-year currently smoking OR have quit w/in 15years.) does not qualify.   Lung Cancer Screening Referral: n/a  Additional Screening:  Hepatitis C Screening: does not qualify;   Vision Screening: Recommended annual ophthalmology exams for early detection of glaucoma and other disorders of the eye. Is the patient up to date with their annual eye exam?  Yes  Who is the provider or what is the name of the office in which the patient attends annual eye exams? [email or web address] pt is not established with a provider, would they like to be referred to a provider to establish care? No .   Dental Screening: Recommended annual dental exams for proper oral hygiene  Community Resource  Referral / Chronic Care Management: CRR required this visit?  No   CCM required this visit?  No      Plan:     I have personally reviewed and noted the following in the patient's chart:   Medical and social history Use of alcohol, tobacco or illicit drugs  Current medications and supplements including opioid prescriptions. Patient is not currently taking opioid prescriptions. Functional ability and status Nutritional status Physical activity Advanced directives List of other physicians Hospitalizations, surgeries, and ER visits in previous 12 months Vitals Screenings to include cognitive, depression, and falls Referrals and appointments  In addition, I have reviewed and discussed with patient certain preventive protocols, quality metrics, and best practice recommendations. A written personalized care plan for preventive services as well as general preventive health recommendations were provided to patient.     LDaphane Shepherd LPN   [id number]  Nurse Notes: none

## 2022-02-07 NOTE — Patient Instructions (Signed)
Rachael Bryant , Thank you for taking time to come for your Medicare Wellness Visit. I appreciate your ongoing commitment to your health goals. Please review the following plan we discussed and let me know if I can assist you in the future.   These are the goals we discussed:  Goals      Patient Stated     Wants to be independent, stay healthy, happy, and spend plenty of time with grandchildren        This is a list of the screening recommended for you and due dates:  Health Maintenance  Topic Date Due   Zoster (Shingles) Vaccine (1 of 2) Never done   COVID-19 Vaccine (3 - Moderna risk series) 05/17/2019   Mammogram  07/18/2021   Flu Shot  05/11/2022*   DEXA scan (bone density measurement)  02/01/2023*   Medicare Annual Wellness Visit  02/08/2023   DTaP/Tdap/Td vaccine (2 - Td or Tdap) 02/26/2025   Colon Cancer Screening  01/16/2028   Pneumonia Vaccine  Completed   Hepatitis C Screening: USPSTF Recommendation to screen - Ages 55-79 yo.  Completed   HPV Vaccine  Aged Out  *Topic was postponed. The date shown is not the original due date.    Advanced directives: Advance directive discussed with you today. I have provided a copy for you to complete at home and have notarized. Once this is complete please bring a copy in to our office so we can scan it into your chart.   Conditions/risks identified: Aim for 30 minutes of exercise or brisk walking, 6-8 glasses of water, and 5 servings of fruits and vegetables each day.   Next appointment: Follow up in one year for your annual wellness visit    Preventive Care 65 Years and Older, Female Preventive care refers to lifestyle choices and visits with your health care provider that can promote health and wellness. What does preventive care include? A yearly physical exam. This is also called an annual well check. Dental exams once or twice a year. Routine eye exams. Ask your health care provider how often you should have your eyes  checked. Personal lifestyle choices, including: Daily care of your teeth and gums. Regular physical activity. Eating a healthy diet. Avoiding tobacco and drug use. Limiting alcohol use. Practicing safe sex. Taking low-dose aspirin every day. Taking vitamin and mineral supplements as recommended by your health care provider. What happens during an annual well check? The services and screenings done by your health care provider during your annual well check will depend on your age, overall health, lifestyle risk factors, and family history of disease. Counseling  Your health care provider may ask you questions about your: Alcohol use. Tobacco use. Drug use. Emotional well-being. Home and relationship well-being. Sexual activity. Eating habits. History of falls. Memory and ability to understand (cognition). Work and work Statistician. Reproductive health. Screening  You may have the following tests or measurements: Height, weight, and BMI. Blood pressure. Lipid and cholesterol levels. These may be checked every 5 years, or more frequently if you are over 50 years old. Skin check. Lung cancer screening. You may have this screening every year starting at age 82 if you have a 30-pack-year history of smoking and currently smoke or have quit within the past 15 years. Fecal occult blood test (FOBT) of the stool. You may have this test every year starting at age 57. Flexible sigmoidoscopy or colonoscopy. You may have a sigmoidoscopy every 5 years or a colonoscopy every 10  years starting at age 82. Hepatitis C blood test. Hepatitis B blood test. Sexually transmitted disease (STD) testing. Diabetes screening. This is done by checking your blood sugar (glucose) after you have not eaten for a while (fasting). You may have this done every 1-3 years. Bone density scan. This is done to screen for osteoporosis. You may have this done starting at age 54. Mammogram. This may be done every 1-2  years. Talk to your health care provider about how often you should have regular mammograms. Talk with your health care provider about your test results, treatment options, and if necessary, the need for more tests. Vaccines  Your health care provider may recommend certain vaccines, such as: Influenza vaccine. This is recommended every year. Tetanus, diphtheria, and acellular pertussis (Tdap, Td) vaccine. You may need a Td booster every 10 years. Zoster vaccine. You may need this after age 8. Pneumococcal 13-valent conjugate (PCV13) vaccine. One dose is recommended after age 39. Pneumococcal polysaccharide (PPSV23) vaccine. One dose is recommended after age 10. Talk to your health care provider about which screenings and vaccines you need and how often you need them. This information is not intended to replace advice given to you by your health care provider. Make sure you discuss any questions you have with your health care provider. Document Released: 02/23/2015 Document Revised: 10/17/2015 Document Reviewed: 11/28/2014 Elsevier Interactive Patient Education  2017 [place] Prevention in the Home Falls can cause injuries. They can happen to people of all ages. There are many things you can do to make your home safe and to help prevent falls. What can I do on the outside of my home? Regularly fix the edges of walkways and driveways and fix any cracks. Remove anything that might make you trip as you walk through a door, such as a raised step or threshold. Trim any bushes or trees on the path to your home. Use bright outdoor lighting. Clear any walking paths of anything that might make someone trip, such as rocks or tools. Regularly check to see if handrails are loose or broken. Make sure that both sides of any steps have handrails. Any raised decks and porches should have guardrails on the edges. Have any leaves, snow, or ice cleared regularly. Use sand or salt on walking paths  during winter. Clean up any spills in your garage right away. This includes oil or grease spills. What can I do in the bathroom? Use night lights. Install grab bars by the toilet and in the tub and shower. Do not use towel bars as grab bars. Use non-skid mats or decals in the tub or shower. If you need to sit down in the shower, use a plastic, non-slip stool. Keep the floor dry. Clean up any water that spills on the floor as soon as it happens. Remove soap buildup in the tub or shower regularly. Attach bath mats securely with double-sided non-slip rug tape. Do not have throw rugs and other things on the floor that can make you trip. What can I do in the bedroom? Use night lights. Make sure that you have a light by your bed that is easy to reach. Do not use any sheets or blankets that are too big for your bed. They should not hang down onto the floor. Have a firm chair that has side arms. You can use this for support while you get dressed. Do not have throw rugs and other things on the floor that can make you trip.  What can I do in the kitchen? Clean up any spills right away. Avoid walking on wet floors. Keep items that you use a lot in easy-to-reach places. If you need to reach something above you, use a strong step stool that has a grab bar. Keep electrical cords out of the way. Do not use floor polish or wax that makes floors slippery. If you must use wax, use non-skid floor wax. Do not have throw rugs and other things on the floor that can make you trip. What can I do with my stairs? Do not leave any items on the stairs. Make sure that there are handrails on both sides of the stairs and use them. Fix handrails that are broken or loose. Make sure that handrails are as long as the stairways. Check any carpeting to make sure that it is firmly attached to the stairs. Fix any carpet that is loose or worn. Avoid having throw rugs at the top or bottom of the stairs. If you do have throw  rugs, attach them to the floor with carpet tape. Make sure that you have a light switch at the top of the stairs and the bottom of the stairs. If you do not have them, ask someone to add them for you. What else can I do to help prevent falls? Wear shoes that: Do not have high heels. Have rubber bottoms. Are comfortable and fit you well. Are closed at the toe. Do not wear sandals. If you use a stepladder: Make sure that it is fully opened. Do not climb a closed stepladder. Make sure that both sides of the stepladder are locked into place. Ask someone to hold it for you, if possible. Clearly mark and make sure that you can see: Any grab bars or handrails. First and last steps. Where the edge of each step is. Use tools that help you move around (mobility aids) if they are needed. These include: Canes. Walkers. Scooters. Crutches. Turn on the lights when you go into a dark area. Replace any light bulbs as soon as they burn out. Set up your furniture so you have a clear path. Avoid moving your furniture around. If any of your floors are uneven, fix them. If there are any pets around you, be aware of where they are. Review your medicines with your doctor. Some medicines can make you feel dizzy. This can increase your chance of falling. Ask your doctor what other things that you can do to help prevent falls. This information is not intended to replace advice given to you by your health care provider. Make sure you discuss any questions you have with your health care provider. Document Released: 11/23/2008 Document Revised: 07/05/2015 Document Reviewed: 03/03/2014 Elsevier Interactive Patient Education  2017 [employer].

## 2022-02-11 ENCOUNTER — Encounter (INDEPENDENT_AMBULATORY_CARE_PROVIDER_SITE_OTHER): Payer: Medicare Other | Admitting: Ophthalmology

## 2022-02-11 DIAGNOSIS — H353121 Nonexudative age-related macular degeneration, left eye, early dry stage: Secondary | ICD-10-CM | POA: Diagnosis not present

## 2022-02-11 DIAGNOSIS — H43813 Vitreous degeneration, bilateral: Secondary | ICD-10-CM | POA: Diagnosis not present

## 2022-02-11 DIAGNOSIS — H353211 Exudative age-related macular degeneration, right eye, with active choroidal neovascularization: Secondary | ICD-10-CM | POA: Diagnosis not present

## 2022-03-11 ENCOUNTER — Encounter (INDEPENDENT_AMBULATORY_CARE_PROVIDER_SITE_OTHER): Payer: Medicare Other | Admitting: Ophthalmology

## 2022-03-11 DIAGNOSIS — H43813 Vitreous degeneration, bilateral: Secondary | ICD-10-CM

## 2022-03-11 DIAGNOSIS — H353211 Exudative age-related macular degeneration, right eye, with active choroidal neovascularization: Secondary | ICD-10-CM

## 2022-03-11 DIAGNOSIS — H353121 Nonexudative age-related macular degeneration, left eye, early dry stage: Secondary | ICD-10-CM

## 2022-04-07 ENCOUNTER — Ambulatory Visit: Payer: Medicare Other | Admitting: Family Medicine

## 2022-04-08 ENCOUNTER — Other Ambulatory Visit: Payer: Self-pay | Admitting: Family Medicine

## 2022-04-08 ENCOUNTER — Encounter (INDEPENDENT_AMBULATORY_CARE_PROVIDER_SITE_OTHER): Payer: Medicare Other | Admitting: Ophthalmology

## 2022-04-08 DIAGNOSIS — H353211 Exudative age-related macular degeneration, right eye, with active choroidal neovascularization: Secondary | ICD-10-CM | POA: Diagnosis not present

## 2022-04-08 DIAGNOSIS — H353121 Nonexudative age-related macular degeneration, left eye, early dry stage: Secondary | ICD-10-CM | POA: Diagnosis not present

## 2022-04-08 DIAGNOSIS — H43813 Vitreous degeneration, bilateral: Secondary | ICD-10-CM

## 2022-04-08 DIAGNOSIS — F339 Major depressive disorder, recurrent, unspecified: Secondary | ICD-10-CM

## 2022-04-08 DIAGNOSIS — F411 Generalized anxiety disorder: Secondary | ICD-10-CM

## 2022-04-09 DIAGNOSIS — D225 Melanocytic nevi of trunk: Secondary | ICD-10-CM | POA: Diagnosis not present

## 2022-04-09 DIAGNOSIS — L820 Inflamed seborrheic keratosis: Secondary | ICD-10-CM | POA: Diagnosis not present

## 2022-04-09 DIAGNOSIS — L986 Other infiltrative disorders of the skin and subcutaneous tissue: Secondary | ICD-10-CM | POA: Diagnosis not present

## 2022-04-09 DIAGNOSIS — Z1283 Encounter for screening for malignant neoplasm of skin: Secondary | ICD-10-CM | POA: Diagnosis not present

## 2022-04-09 DIAGNOSIS — D2272 Melanocytic nevi of left lower limb, including hip: Secondary | ICD-10-CM | POA: Diagnosis not present

## 2022-04-09 DIAGNOSIS — D485 Neoplasm of uncertain behavior of skin: Secondary | ICD-10-CM | POA: Diagnosis not present

## 2022-04-16 ENCOUNTER — Encounter: Payer: Self-pay | Admitting: Family Medicine

## 2022-04-16 ENCOUNTER — Ambulatory Visit (INDEPENDENT_AMBULATORY_CARE_PROVIDER_SITE_OTHER): Payer: Medicare Other | Admitting: Family Medicine

## 2022-04-16 VITALS — BP 158/79 | HR 66 | Temp 97.8°F | Ht 67.0 in | Wt 176.8 lb

## 2022-04-16 DIAGNOSIS — F411 Generalized anxiety disorder: Secondary | ICD-10-CM | POA: Diagnosis not present

## 2022-04-16 DIAGNOSIS — E785 Hyperlipidemia, unspecified: Secondary | ICD-10-CM

## 2022-04-16 DIAGNOSIS — F339 Major depressive disorder, recurrent, unspecified: Secondary | ICD-10-CM | POA: Diagnosis not present

## 2022-04-16 DIAGNOSIS — I10 Essential (primary) hypertension: Secondary | ICD-10-CM

## 2022-04-16 DIAGNOSIS — G250 Essential tremor: Secondary | ICD-10-CM

## 2022-04-16 MED ORDER — PROPRANOLOL HCL ER 120 MG PO CP24: ORAL_CAPSULE | ORAL | 3 refills | Status: DC

## 2022-04-16 MED ORDER — CITALOPRAM HYDROBROMIDE 10 MG PO TABS: 10.0000 mg | ORAL_TABLET | Freq: Every day | ORAL | 2 refills | Status: DC

## 2022-04-16 NOTE — Patient Instructions (Signed)

## 2022-04-16 NOTE — Progress Notes (Unsigned)
Subjective:  Patient ID: Rachael Bryant, female    DOB: 12-Sep-1950  Age: 72 y.o. MRN: [id number]  CC: Medical Management of Chronic Issues   HPI [name] presents for recheck of her depression and anxiety. Both are doing well on her current regimen - see surveys below.  Pt. taking propranolol for tremor. Very satisfied with its control.BP high also. No previous hx, but readings have been quite high recently.       04/16/2022    2:33 PM 02/07/2022    8:18 AM 10/02/2021    2:25 PM  Depression screen PHQ 2/9  Decreased Interest 0 0 0  Down, Depressed, Hopeless 0 0 0  PHQ - 2 Score 0 0 0      02/13/2021    2:58 PM 11/01/2019    4:05 PM  GAD 7 : Generalized Anxiety Score  Nervous, Anxious, on Edge 0 0  Control/stop worrying 0 0  Worry too much - different things 0 0  Trouble relaxing 0 0  Restless 0 0  Easily annoyed or irritable 0 0  Afraid - awful might happen 0 0  Total GAD 7 Score 0 0  Anxiety Difficulty Not difficult at all Somewhat difficult     History Camdyn has a past medical history of Anxiety, High cholesterol, Nodular 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   She has a past surgical history that includes Tubal ligation; Colonoscopy with propofol (N/A, 01/15/2021); and polypectomy (01/15/2021).   Her family history includes Diabetes in her mother; Heart attack in her father; Parkinsonism in her sister; Pulmonary fibrosis in her mother.She reports that she has never smoked. She has never used smokeless tobacco. She reports that she does not drink alcohol and does not use drugs.    ROS Review of Systems  Constitutional: Negative.   HENT: Negative.    Eyes:  Negative for visual disturbance.  Respiratory:  Negative for shortness of breath.   Cardiovascular:  Negative for chest pain.  Gastrointestinal:  Negative for abdominal pain.  Musculoskeletal:  Negative for arthralgias.    Objective:  BP (!)  158/79   Pulse 66   Temp 97.8 F (36.6 C)   Ht '5\' 7"'$  (1.702 m)   Wt 176 lb 12.8 oz (80.2 kg)   SpO2 94%   BMI 27.69 kg/m   BP Readings from Last 3 Encounters:  04/16/22 (!) 158/79  10/02/21 135/80  02/13/21 138/81    Wt Readings from Last 3 Encounters:  04/16/22 176 lb 12.8 oz (80.2 kg)  02/07/22 174 lb (78.9 kg)  10/02/21 178 lb 3.2 oz (80.8 kg)     Physical Exam Constitutional:      General: She is not in acute distress.    Appearance: She is well-developed.  Cardiovascular:     Rate and Rhythm: Normal rate and regular rhythm.  Pulmonary:     Breath sounds: Normal breath sounds.  Musculoskeletal:        General: Normal range of motion.  Skin:    General: Skin is warm and dry.  Neurological:     Mental Status: She is alert and oriented to person, place, and time.       Assessment & Plan:   Talanda was seen today for medical management of chronic issues.  Diagnoses and all orders for this visit:  Dyslipidemia -     CBC with Differential/Platelet -     CMP14+EGFR -     Lipid panel  Anxiety, generalized -     CBC with Differential/Platelet -     CMP14+EGFR -     citalopram (CELEXA) 10 MG tablet; Take 1 tablet (10 mg total) by mouth daily.  Recurrent major depressive disorder, remission status unspecified ([place]) -     CBC with Differential/Platelet -     CMP14+EGFR -     citalopram (CELEXA) 10 MG tablet; Take 1 tablet (10 mg total) by mouth daily.  Essential tremor -     CBC with Differential/Platelet -     CMP14+EGFR -     propranolol ER (INDERAL LA) 120 MG 24 hr capsule; TAKE 1 CAPSULE BY MOUTH EVERY DAY  Primary hypertension       I have discontinued Lux B. Kravitz's benzonatate and guaiFENesin. I have also changed her citalopram and propranolol ER. Additionally, I am having her maintain her diphenhydrAMINE, cyanocobalamin, Fish Oil, Calcium Carbonate (CALCIUM 600 PO), alendronate, simvastatin, Vitamin D (Ergocalciferol),  trimethoprim-polymyxin b, and albuterol.  Allergies as of 04/16/2022       Reactions   Naproxen Sodium Hives   Ibuprofen Hives   Other Hives   NSAIDS   Codeine Nausea Only        Medication List        Accurate as of April 16, 2022 11:59 PM. If you have any questions, ask your nurse or doctor.          STOP taking these medications    benzonatate 100 MG capsule Commonly known as: Best boy Stopped by: Claretta Fraise, MD   guaiFENesin 600 MG 12 hr tablet Commonly known as: Mucinex Stopped by: Claretta Fraise, MD       TAKE these medications    albuterol 108 (90 Base) MCG/ACT inhaler Commonly known as: VENTOLIN HFA Inhale 2 puffs into the lungs every 6 (six) hours as needed for wheezing or shortness of breath.   alendronate 70 MG tablet Commonly known as: FOSAMAX Take 1 tablet (70 mg total) by mouth once a week.   CALCIUM 600 PO Take by mouth. Takes 3 -4 a day   citalopram 10 MG tablet Commonly known as: CELEXA Take 1 tablet (10 mg total) by mouth daily.   cyanocobalamin 1000 MCG tablet Take 1,000 mcg by mouth daily.   diphenhydrAMINE 25 mg capsule Commonly known as: BENADRYL Take 50 mg by mouth at bedtime.   Fish Oil 1200 MG Caps Take by mouth.   propranolol ER 120 MG 24 hr capsule Commonly known as: INDERAL LA TAKE 1 CAPSULE BY MOUTH EVERY DAY What changed: medication strength Changed by: Claretta Fraise, MD   simvastatin 20 MG tablet Commonly known as: ZOCOR Take 1 tablet (20 mg total) by mouth at bedtime.   trimethoprim-polymyxin b ophthalmic solution Commonly known as: POLYTRIM USE 1 DROP IN RIGHT EYE 4 TIMES DAILY FOR 2 DAYS AFTER EACH MONTHLY EYE INJECTION   Vitamin D (Ergocalciferol) 1.25 MG (50000 UNIT) Caps capsule Commonly known as: DRISDOL Take 1 capsule (50,000 Units total) by mouth every 7 (seven) days.         Follow-up: Return in about 6 weeks (around 05/28/2022).  Claretta Fraise, M.D.

## 2022-04-17 ENCOUNTER — Encounter: Payer: Self-pay | Admitting: Family Medicine

## 2022-04-18 ENCOUNTER — Other Ambulatory Visit: Payer: Medicare Other

## 2022-04-18 DIAGNOSIS — E785 Hyperlipidemia, unspecified: Secondary | ICD-10-CM | POA: Diagnosis not present

## 2022-04-18 DIAGNOSIS — G250 Essential tremor: Secondary | ICD-10-CM | POA: Diagnosis not present

## 2022-04-18 LAB — CBC WITH DIFFERENTIAL/PLATELET
Basophils Absolute: 0 x10E3/uL (ref 0.0–0.2)
Basos: 1 %
EOS (ABSOLUTE): 0.1 x10E3/uL (ref 0.0–0.4)
Eos: 2 %
Hematocrit: 40.5 % (ref 34.0–46.6)
Hemoglobin: 13.3 g/dL (ref 11.1–15.9)
Immature Grans (Abs): 0 x10E3/uL (ref 0.0–0.1)
Immature Granulocytes: 0 %
Lymphocytes Absolute: 1.7 x10E3/uL (ref 0.7–3.1)
Lymphs: 29 %
MCH: 29.2 pg (ref 26.6–33.0)
MCHC: 32.8 g/dL (ref 31.5–35.7)
MCV: 89 fL (ref 79–97)
Monocytes Absolute: 0.5 x10E3/uL (ref 0.1–0.9)
Monocytes: 8 %
Neutrophils Absolute: 3.4 x10E3/uL (ref 1.4–7.0)
Neutrophils: 60 %
Platelets: 267 x10E3/uL (ref 150–450)
RBC: 4.55 x10E6/uL (ref 3.77–5.28)
RDW: 12.8 % (ref 11.7–15.4)
WBC: 5.7 x10E3/uL (ref 3.4–10.8)

## 2022-04-18 LAB — CMP14+EGFR
ALT: 13 IU/L (ref 0–32)
AST: 19 IU/L (ref 0–40)
Albumin/Globulin Ratio: 1.7 (ref 1.2–2.2)
Albumin: 4.1 g/dL (ref 3.9–4.9)
Alkaline Phosphatase: 107 IU/L (ref 44–121)
BUN/Creatinine Ratio: 21 (ref 12–28)
BUN: 17 mg/dL (ref 8–27)
Bilirubin Total: 0.2 mg/dL (ref 0.0–1.2)
CO2: 23 mmol/L (ref 20–29)
Calcium: 9 mg/dL (ref 8.7–10.3)
Chloride: 104 mmol/L (ref 96–106)
Creatinine, Ser: 0.8 mg/dL (ref 0.57–1.00)
Globulin, Total: 2.4 g/dL (ref 1.5–4.5)
Glucose: 105 mg/dL — ABNORMAL HIGH (ref 70–99)
Potassium: 4.6 mmol/L (ref 3.5–5.2)
Sodium: 141 mmol/L (ref 134–144)
Total Protein: 6.5 g/dL (ref 6.0–8.5)
eGFR: 80 mL/min/{1.73_m2} (ref >59)

## 2022-04-18 LAB — LIPID PANEL
Chol/HDL Ratio: 3.8 ratio (ref 0.0–4.4)
Cholesterol, Total: 172 mg/dL (ref 100–199)
HDL: 45 mg/dL (ref >39)
LDL Chol Calc (NIH): 106 mg/dL — ABNORMAL HIGH (ref 0–99)
Triglycerides: 116 mg/dL (ref 0–149)
VLDL Cholesterol Cal: 21 mg/dL (ref 5–40)

## 2022-04-20 NOTE — Progress Notes (Signed)
Hello Emberley,  Your lab result is normal and/or stable.Some minor variations that are not significant are commonly marked abnormal, but do not represent any medical problem for you.  Best regards, Katriana Dortch, M.D.

## 2022-04-22 DIAGNOSIS — D485 Neoplasm of uncertain behavior of skin: Secondary | ICD-10-CM | POA: Diagnosis not present

## 2022-04-22 DIAGNOSIS — L988 Other specified disorders of the skin and subcutaneous tissue: Secondary | ICD-10-CM | POA: Diagnosis not present

## 2022-05-06 ENCOUNTER — Encounter (INDEPENDENT_AMBULATORY_CARE_PROVIDER_SITE_OTHER): Payer: Medicare Other | Admitting: Ophthalmology

## 2022-05-06 DIAGNOSIS — H353211 Exudative age-related macular degeneration, right eye, with active choroidal neovascularization: Secondary | ICD-10-CM | POA: Diagnosis not present

## 2022-05-06 DIAGNOSIS — H2513 Age-related nuclear cataract, bilateral: Secondary | ICD-10-CM | POA: Diagnosis not present

## 2022-05-06 DIAGNOSIS — H353121 Nonexudative age-related macular degeneration, left eye, early dry stage: Secondary | ICD-10-CM

## 2022-05-06 DIAGNOSIS — H43813 Vitreous degeneration, bilateral: Secondary | ICD-10-CM | POA: Diagnosis not present

## 2022-05-19 ENCOUNTER — Ambulatory Visit: Payer: Medicare Other | Admitting: Family Medicine

## 2022-05-29 ENCOUNTER — Ambulatory Visit: Payer: Medicare Other | Admitting: Family Medicine

## 2022-06-03 ENCOUNTER — Encounter (INDEPENDENT_AMBULATORY_CARE_PROVIDER_SITE_OTHER): Payer: Medicare Other | Admitting: Ophthalmology

## 2022-06-03 DIAGNOSIS — H353211 Exudative age-related macular degeneration, right eye, with active choroidal neovascularization: Secondary | ICD-10-CM

## 2022-06-03 DIAGNOSIS — H43813 Vitreous degeneration, bilateral: Secondary | ICD-10-CM | POA: Diagnosis not present

## 2022-06-03 DIAGNOSIS — H353121 Nonexudative age-related macular degeneration, left eye, early dry stage: Secondary | ICD-10-CM | POA: Diagnosis not present

## 2022-07-01 ENCOUNTER — Encounter (INDEPENDENT_AMBULATORY_CARE_PROVIDER_SITE_OTHER): Payer: Medicare Other | Admitting: Ophthalmology

## 2022-07-01 DIAGNOSIS — H353121 Nonexudative age-related macular degeneration, left eye, early dry stage: Secondary | ICD-10-CM | POA: Diagnosis not present

## 2022-07-01 DIAGNOSIS — H43813 Vitreous degeneration, bilateral: Secondary | ICD-10-CM | POA: Diagnosis not present

## 2022-07-01 DIAGNOSIS — H353211 Exudative age-related macular degeneration, right eye, with active choroidal neovascularization: Secondary | ICD-10-CM

## 2022-07-23 DIAGNOSIS — H25811 Combined forms of age-related cataract, right eye: Secondary | ICD-10-CM | POA: Diagnosis not present

## 2022-07-23 DIAGNOSIS — H353211 Exudative age-related macular degeneration, right eye, with active choroidal neovascularization: Secondary | ICD-10-CM | POA: Diagnosis not present

## 2022-07-23 DIAGNOSIS — H353121 Nonexudative age-related macular degeneration, left eye, early dry stage: Secondary | ICD-10-CM | POA: Diagnosis not present

## 2022-07-28 ENCOUNTER — Encounter (INDEPENDENT_AMBULATORY_CARE_PROVIDER_SITE_OTHER): Payer: Medicare Other | Admitting: Ophthalmology

## 2022-07-28 DIAGNOSIS — H43813 Vitreous degeneration, bilateral: Secondary | ICD-10-CM | POA: Diagnosis not present

## 2022-07-28 DIAGNOSIS — H2513 Age-related nuclear cataract, bilateral: Secondary | ICD-10-CM

## 2022-07-28 DIAGNOSIS — H353121 Nonexudative age-related macular degeneration, left eye, early dry stage: Secondary | ICD-10-CM | POA: Diagnosis not present

## 2022-07-28 DIAGNOSIS — H353211 Exudative age-related macular degeneration, right eye, with active choroidal neovascularization: Secondary | ICD-10-CM | POA: Diagnosis not present

## 2022-08-12 DIAGNOSIS — H25811 Combined forms of age-related cataract, right eye: Secondary | ICD-10-CM | POA: Diagnosis not present

## 2022-08-20 ENCOUNTER — Other Ambulatory Visit: Payer: Self-pay | Admitting: Family Medicine

## 2022-08-21 DIAGNOSIS — H2512 Age-related nuclear cataract, left eye: Secondary | ICD-10-CM | POA: Diagnosis not present

## 2022-08-25 ENCOUNTER — Encounter (INDEPENDENT_AMBULATORY_CARE_PROVIDER_SITE_OTHER): Payer: Medicare Other | Admitting: Ophthalmology

## 2022-08-25 DIAGNOSIS — H43813 Vitreous degeneration, bilateral: Secondary | ICD-10-CM | POA: Diagnosis not present

## 2022-08-25 DIAGNOSIS — H2512 Age-related nuclear cataract, left eye: Secondary | ICD-10-CM | POA: Diagnosis not present

## 2022-08-25 DIAGNOSIS — H353121 Nonexudative age-related macular degeneration, left eye, early dry stage: Secondary | ICD-10-CM | POA: Diagnosis not present

## 2022-08-25 DIAGNOSIS — H353211 Exudative age-related macular degeneration, right eye, with active choroidal neovascularization: Secondary | ICD-10-CM | POA: Diagnosis not present

## 2022-08-26 DIAGNOSIS — H25812 Combined forms of age-related cataract, left eye: Secondary | ICD-10-CM | POA: Diagnosis not present

## 2022-08-26 DIAGNOSIS — H2589 Other age-related cataract: Secondary | ICD-10-CM | POA: Diagnosis not present

## 2022-09-07 IMAGING — DX DG CHEST 2V
2 series · 2 of 2 positions shown · non-contrast
Comparison: None.

CLINICAL DATA: Cough and wheezing.

EXAM:
CHEST - 2 VIEW

[chest pa]
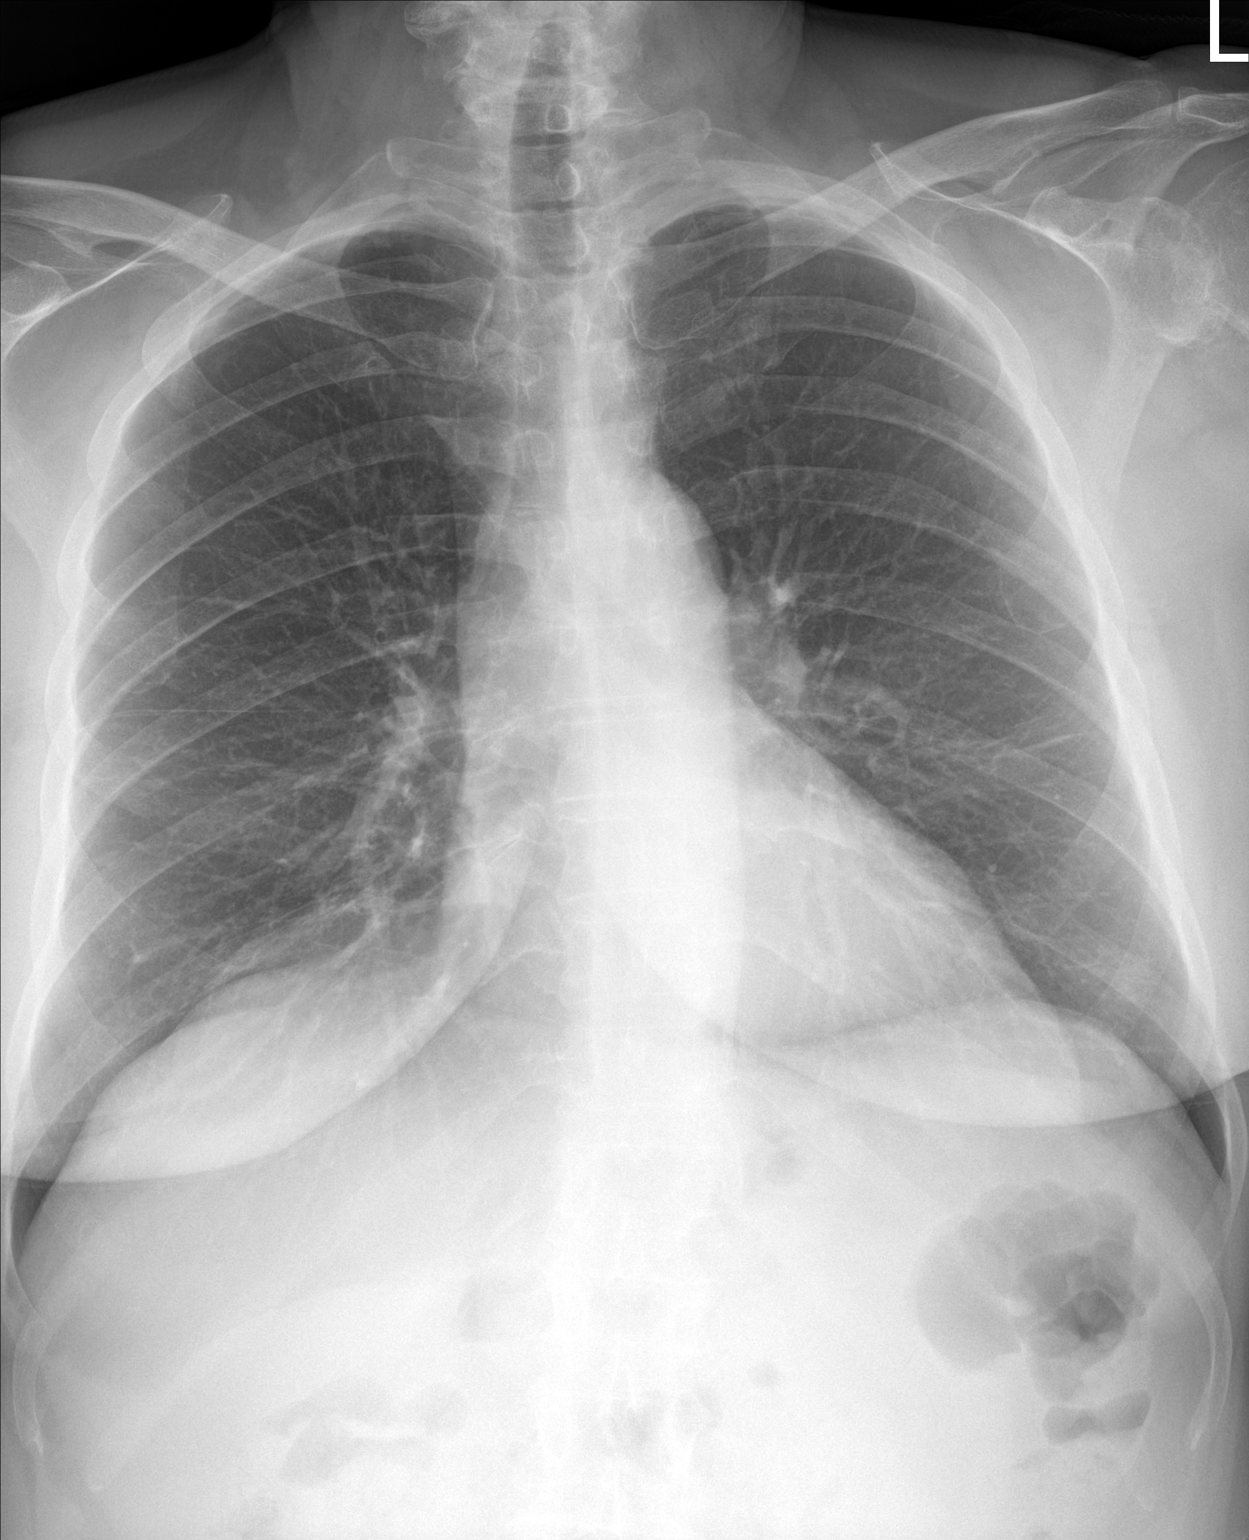

[chest lat]
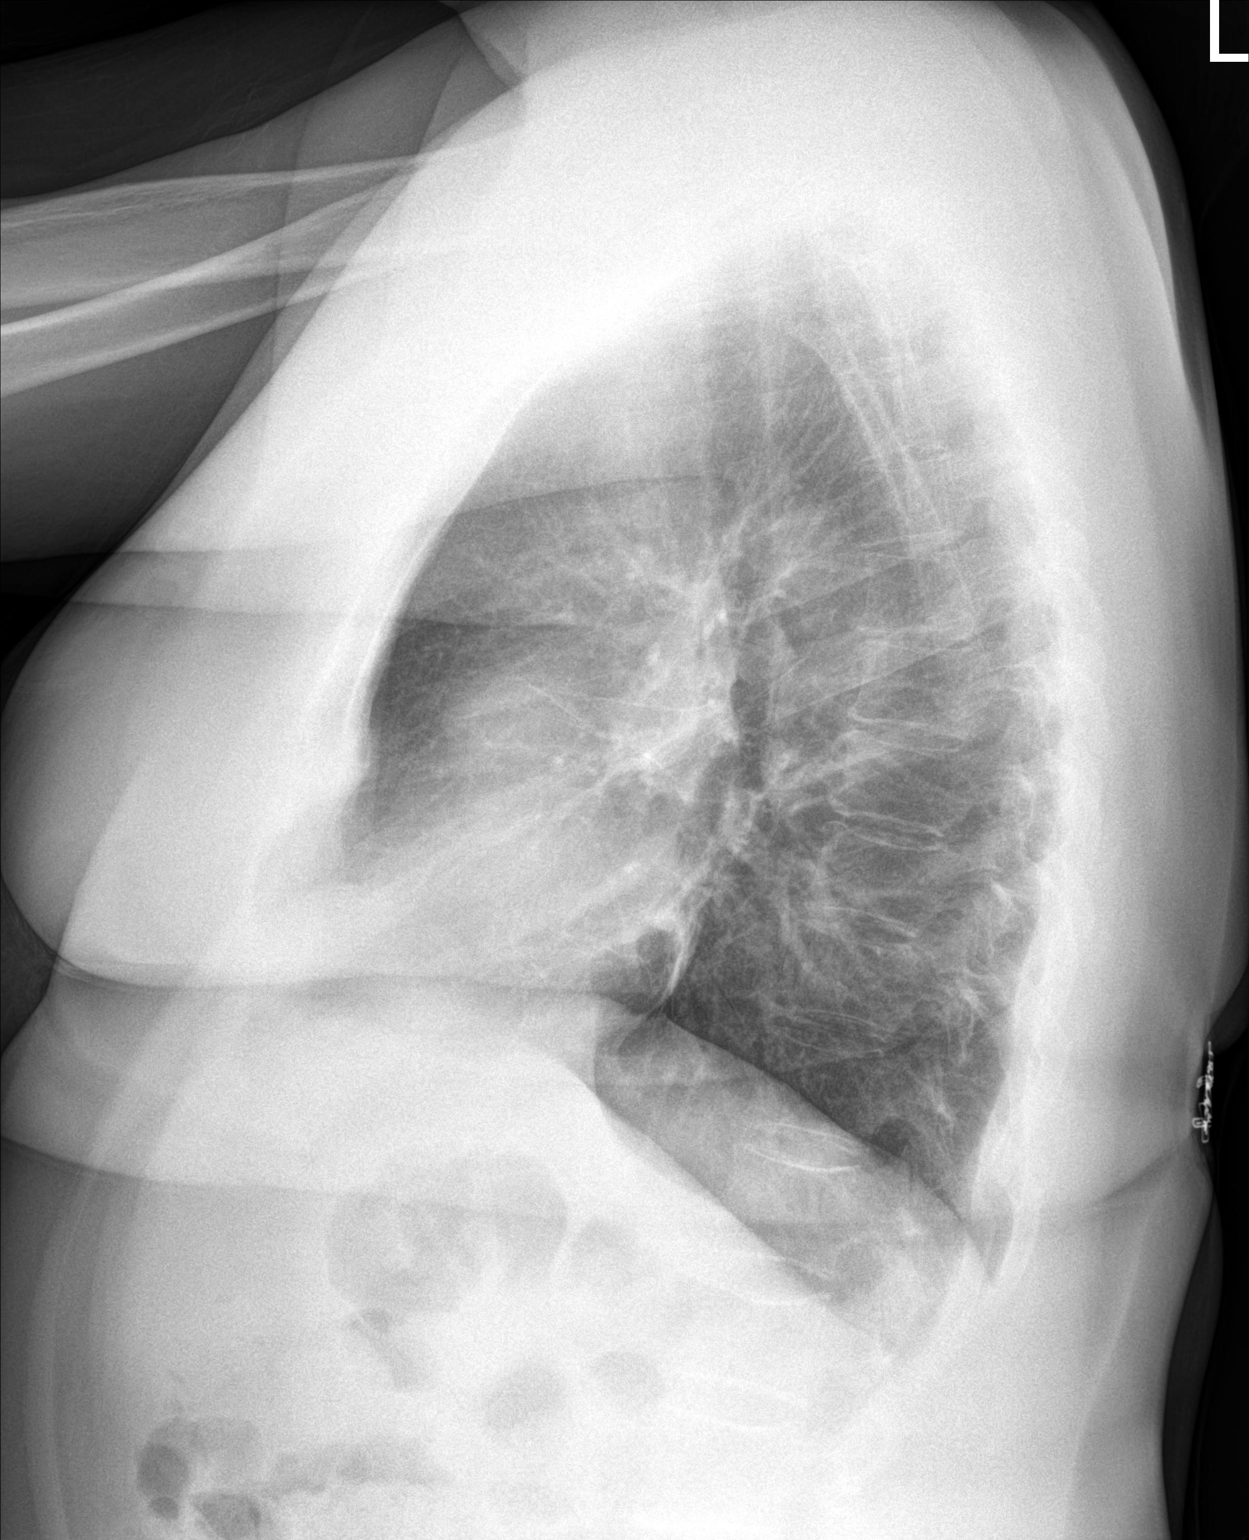

[2 of 2 positions shown; findings below may reference images not displayed]

FINDINGS: The heart size and mediastinal contours are within normal limits.
Both lungs are clear. The visualized skeletal structures are
unremarkable.
IMPRESSION: No active cardiopulmonary disease.

## 2022-09-23 ENCOUNTER — Encounter (INDEPENDENT_AMBULATORY_CARE_PROVIDER_SITE_OTHER): Payer: Medicare Other | Admitting: Ophthalmology

## 2022-09-23 DIAGNOSIS — H353121 Nonexudative age-related macular degeneration, left eye, early dry stage: Secondary | ICD-10-CM | POA: Diagnosis not present

## 2022-09-23 DIAGNOSIS — H43813 Vitreous degeneration, bilateral: Secondary | ICD-10-CM | POA: Diagnosis not present

## 2022-09-23 DIAGNOSIS — H353211 Exudative age-related macular degeneration, right eye, with active choroidal neovascularization: Secondary | ICD-10-CM

## 2022-10-21 ENCOUNTER — Encounter (INDEPENDENT_AMBULATORY_CARE_PROVIDER_SITE_OTHER): Payer: Medicare Other | Admitting: Ophthalmology

## 2022-10-21 DIAGNOSIS — H353211 Exudative age-related macular degeneration, right eye, with active choroidal neovascularization: Secondary | ICD-10-CM

## 2022-10-21 DIAGNOSIS — H353121 Nonexudative age-related macular degeneration, left eye, early dry stage: Secondary | ICD-10-CM | POA: Diagnosis not present

## 2022-10-21 DIAGNOSIS — H43813 Vitreous degeneration, bilateral: Secondary | ICD-10-CM | POA: Diagnosis not present

## 2022-11-18 ENCOUNTER — Encounter (INDEPENDENT_AMBULATORY_CARE_PROVIDER_SITE_OTHER): Payer: Medicare Other | Admitting: Ophthalmology

## 2022-11-18 DIAGNOSIS — H43813 Vitreous degeneration, bilateral: Secondary | ICD-10-CM

## 2022-11-18 DIAGNOSIS — H353121 Nonexudative age-related macular degeneration, left eye, early dry stage: Secondary | ICD-10-CM

## 2022-11-18 DIAGNOSIS — H353211 Exudative age-related macular degeneration, right eye, with active choroidal neovascularization: Secondary | ICD-10-CM | POA: Diagnosis not present

## 2022-12-03 ENCOUNTER — Other Ambulatory Visit: Payer: Self-pay | Admitting: Family Medicine

## 2022-12-03 ENCOUNTER — Encounter: Payer: Self-pay | Admitting: Family Medicine

## 2022-12-03 NOTE — Telephone Encounter (Signed)
Lmtcb to schedule appt Letter mailed

## 2022-12-03 NOTE — Telephone Encounter (Signed)
Stacks pt NTBS 30-d given 11/09/22

## 2022-12-07 ENCOUNTER — Other Ambulatory Visit: Payer: Self-pay | Admitting: Family Medicine

## 2022-12-08 NOTE — Telephone Encounter (Signed)
Stacks pt NTBS 30-d given 11/09/22

## 2022-12-08 NOTE — Telephone Encounter (Signed)
Pt scheduled first appt on Nov 14. Please call in enough meds to last until then.

## 2022-12-12 ENCOUNTER — Encounter: Payer: Self-pay | Admitting: Family Medicine

## 2022-12-16 ENCOUNTER — Encounter (INDEPENDENT_AMBULATORY_CARE_PROVIDER_SITE_OTHER): Payer: Medicare Other | Admitting: Ophthalmology

## 2022-12-16 DIAGNOSIS — H353211 Exudative age-related macular degeneration, right eye, with active choroidal neovascularization: Secondary | ICD-10-CM | POA: Diagnosis not present

## 2022-12-16 DIAGNOSIS — H353121 Nonexudative age-related macular degeneration, left eye, early dry stage: Secondary | ICD-10-CM | POA: Diagnosis not present

## 2022-12-16 DIAGNOSIS — H43813 Vitreous degeneration, bilateral: Secondary | ICD-10-CM | POA: Diagnosis not present

## 2022-12-19 ENCOUNTER — Other Ambulatory Visit: Payer: Medicare Other

## 2022-12-19 DIAGNOSIS — E785 Hyperlipidemia, unspecified: Secondary | ICD-10-CM | POA: Diagnosis not present

## 2022-12-19 DIAGNOSIS — I10 Essential (primary) hypertension: Secondary | ICD-10-CM | POA: Diagnosis not present

## 2022-12-20 LAB — CMP14+EGFR
ALT: 14 [IU]/L (ref 0–32)
AST: 19 [IU]/L (ref 0–40)
Albumin: 4.2 g/dL (ref 3.9–4.9)
Alkaline Phosphatase: 112 IU/L (ref 44–121)
BUN/Creatinine Ratio: 10 — ABNORMAL LOW (ref 12–28)
BUN: 8 mg/dL (ref 8–27)
Bilirubin Total: 0.4 mg/dL (ref 0.0–1.2)
CO2: 23 mmol/L (ref 20–29)
Calcium: 9.1 mg/dL (ref 8.7–10.3)
Chloride: 105 mmol/L (ref 96–106)
Creatinine, Ser: 0.83 mg/dL (ref 0.57–1.00)
Globulin, Total: 2.7 g/dL (ref 1.5–4.5)
Glucose: 106 mg/dL — ABNORMAL HIGH (ref 70–99)
Potassium: 4.3 mmol/L (ref 3.5–5.2)
Sodium: 142 mmol/L (ref 134–144)
Total Protein: 6.9 g/dL (ref 6.0–8.5)
eGFR: 76 mL/min/{1.73_m2} (ref >59)

## 2022-12-20 LAB — CBC WITH DIFFERENTIAL/PLATELET
Basophils Absolute: 0 10*3/uL (ref 0.0–0.2)
Basos: 1 %
EOS (ABSOLUTE): 0.1 10*3/uL (ref 0.0–0.4)
Eos: 2 %
Hematocrit: 41.6 % (ref 34.0–46.6)
Hemoglobin: 13.5 g/dL (ref 11.1–15.9)
Immature Grans (Abs): 0 10*3/uL (ref 0.0–0.1)
Immature Granulocytes: 0 %
Lymphocytes Absolute: 1.7 10*3/uL (ref 0.7–3.1)
Lymphs: 29 %
MCH: 29.5 pg (ref 26.6–33.0)
MCHC: 32.5 g/dL (ref 31.5–35.7)
MCV: 91 fL (ref 79–97)
Monocytes Absolute: 0.5 10*3/uL (ref 0.1–0.9)
Monocytes: 8 %
Neutrophils Absolute: 3.6 10*3/uL (ref 1.4–7.0)
Neutrophils: 60 %
Platelets: 268 10*3/uL (ref 150–450)
RBC: 4.57 x10E6/uL (ref 3.77–5.28)
RDW: 13 % (ref 11.7–15.4)
WBC: 6 10*3/uL (ref 3.4–10.8)

## 2022-12-20 LAB — LIPID PANEL
Chol/HDL Ratio: 4.3 ratio (ref 0.0–4.4)
Cholesterol, Total: 200 mg/dL — ABNORMAL HIGH (ref 100–199)
HDL: 46 mg/dL (ref >39)
LDL Chol Calc (NIH): 131 mg/dL — ABNORMAL HIGH (ref 0–99)
Triglycerides: 129 mg/dL (ref 0–149)
VLDL Cholesterol Cal: 23 mg/dL (ref 5–40)

## 2022-12-24 ENCOUNTER — Other Ambulatory Visit: Payer: Self-pay | Admitting: *Deleted

## 2022-12-24 ENCOUNTER — Telehealth: Payer: Self-pay | Admitting: Family Medicine

## 2022-12-24 DIAGNOSIS — F339 Major depressive disorder, recurrent, unspecified: Secondary | ICD-10-CM

## 2022-12-24 DIAGNOSIS — F411 Generalized anxiety disorder: Secondary | ICD-10-CM

## 2022-12-24 MED ORDER — CITALOPRAM HYDROBROMIDE 10 MG PO TABS: 10.0000 mg | ORAL_TABLET | Freq: Every day | ORAL | 3 refills | Status: DC

## 2022-12-24 MED ORDER — SIMVASTATIN 20 MG PO TABS: 20.0000 mg | ORAL_TABLET | Freq: Every day | ORAL | 3 refills | Status: DC

## 2022-12-24 NOTE — Telephone Encounter (Signed)
Patient was advised she needed to reschedule her appt she is calling in stating that she needs a refill o her meds before her appt which is on dec 4th 2024

## 2022-12-24 NOTE — Telephone Encounter (Signed)
Refilled medications due to run out before visit. Called paitent, no answer, left message to return call

## 2022-12-25 ENCOUNTER — Ambulatory Visit: Payer: Medicare Other | Admitting: Family Medicine

## 2023-01-13 ENCOUNTER — Encounter (INDEPENDENT_AMBULATORY_CARE_PROVIDER_SITE_OTHER): Payer: Medicare Other | Admitting: Ophthalmology

## 2023-01-13 DIAGNOSIS — H353121 Nonexudative age-related macular degeneration, left eye, early dry stage: Secondary | ICD-10-CM

## 2023-01-13 DIAGNOSIS — H43813 Vitreous degeneration, bilateral: Secondary | ICD-10-CM | POA: Diagnosis not present

## 2023-01-13 DIAGNOSIS — H353211 Exudative age-related macular degeneration, right eye, with active choroidal neovascularization: Secondary | ICD-10-CM

## 2023-01-14 ENCOUNTER — Encounter: Payer: Self-pay | Admitting: Family Medicine

## 2023-01-14 ENCOUNTER — Ambulatory Visit: Payer: Medicare Other | Admitting: Family Medicine

## 2023-01-14 VITALS — BP 123/68 | HR 65 | Temp 97.9°F | Ht 67.0 in | Wt 182.0 lb

## 2023-01-14 DIAGNOSIS — E559 Vitamin D deficiency, unspecified: Secondary | ICD-10-CM | POA: Diagnosis not present

## 2023-01-14 DIAGNOSIS — E785 Hyperlipidemia, unspecified: Secondary | ICD-10-CM

## 2023-01-14 DIAGNOSIS — M810 Age-related osteoporosis without current pathological fracture: Secondary | ICD-10-CM | POA: Diagnosis not present

## 2023-01-14 DIAGNOSIS — Z23 Encounter for immunization: Secondary | ICD-10-CM

## 2023-01-14 DIAGNOSIS — G250 Essential tremor: Secondary | ICD-10-CM

## 2023-01-14 DIAGNOSIS — I10 Essential (primary) hypertension: Secondary | ICD-10-CM

## 2023-01-14 MED ORDER — SIMVASTATIN 20 MG PO TABS: 20.0000 mg | ORAL_TABLET | Freq: Every day | ORAL | 3 refills | Status: DC

## 2023-01-14 MED ORDER — POLYMYXIN B-TRIMETHOPRIM 10000-0.1 UNIT/ML-% OP SOLN: OPHTHALMIC | 11 refills | Status: AC

## 2023-01-14 MED ORDER — DENOSUMAB 60 MG/ML ~~LOC~~ SOSY: 60.0000 mg | PREFILLED_SYRINGE | Freq: Once | SUBCUTANEOUS | 0 refills | Status: AC

## 2023-01-14 NOTE — Progress Notes (Signed)
Subjective:  Patient ID: Rachael Bryant,  female    DOB: 1950-03-03  Age: 72 y.o.    CC: No chief complaint on file.   HPI CHARLEENE TILLETT presents for  follow-up of hypertension. Patient has no history of headache chest pain or shortness of breath or recent cough. Patient also denies symptoms of TIA such as numbness weakness lateralizing. Patient denies side effects from medication. States taking it regularly.  Patient also  in for follow-up of elevated cholesterol. Doing well without complaints on current medication. Denies side effects  including myalgia and arthralgia and nausea. Also in today for liver function testing. Currently no chest pain, shortness of breath or other cardiovascular related symptoms noted.  Treating essential tremor with inderal LA. she has been stable on this for many years.  She does have a little bit of tremor.  However it is far far better than years ago when she first started taking the medication.  She is having some trouble with the osteoporosis medicine Fosamax not agreeing with her.  She asks if there is a shot available.  We discussed Prolia as an option.  She asked to be switched to that.  Additionally she is due for vitamin D check to make sure her levels are adequate to support the osteoporosis medication and the 1200 mg a day of calcium that she takes.  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   She has a past surgical history that includes Tubal ligation; Colonoscopy with propofol (N/A, 01/15/2021); and polypectomy (01/15/2021).   Her family history includes Diabetes in her mother; Heart attack in her father; Parkinsonism in her sister; Pulmonary fibrosis in her mother.She reports that she has never smoked. She has never used smokeless tobacco. She reports that she does not drink alcohol and does not use drugs.  Current  Outpatient Medications on File Prior to Visit  Medication Sig Dispense Refill   albuterol (VENTOLIN HFA) 108 (90 Base) MCG/ACT inhaler Inhale 2 puffs into the lungs every 6 (six) hours as needed for wheezing or shortness of breath. 8 g 2   alendronate (FOSAMAX) 70 MG tablet Take 1 tablet (70 mg total) by mouth once a week. 13 tablet 3   Calcium Carbonate (CALCIUM 600 PO) Take by mouth. Takes 3 -4 a day     citalopram (CELEXA) 10 MG tablet Take 1 tablet (10 mg total) by mouth daily. 90 tablet 3   cyanocobalamin 1000 MCG tablet Take 1,000 mcg by mouth daily.     diphenhydrAMINE (BENADRYL) 25 mg capsule Take 50 mg by mouth at bedtime.     Omega-3 Fatty Acids (FISH OIL) 1200 MG CAPS Take by mouth.     propranolol ER (INDERAL LA) 120 MG 24 hr capsule TAKE 1 CAPSULE BY MOUTH EVERY DAY 90 capsule 3   Vitamin D, Ergocalciferol, (DRISDOL) 1.25 MG (50000 UNIT) CAPS capsule Take 1 capsule (50,000 Units total) by mouth every 7 (seven) days. 13 capsule 3   No current facility-administered medications on file prior to visit.    ROS Review of Systems  Constitutional: Negative.   HENT: Negative.    Eyes:  Negative for visual disturbance.  Respiratory:  Negative for shortness of breath.   Cardiovascular:  Negative for chest pain.  Gastrointestinal:  Negative for abdominal pain.  Musculoskeletal:  Negative for arthralgias.    Objective:  BP 123/68   Pulse 65  Temp 97.9 F (36.6 C)   Ht 5\' 7"  (1.702 m)   Wt 182 lb (82.6 kg)   SpO2 95%   BMI 28.51 kg/m   BP Readings from Last 3 Encounters:  01/14/23 123/68  04/16/22 (!) 158/79  10/02/21 135/80    Wt Readings from Last 3 Encounters:  01/14/23 182 lb (82.6 kg)  04/16/22 176 lb 12.8 oz (80.2 kg)  02/07/22 174 lb (78.9 kg)     Physical Exam Constitutional:      General: She is not in acute distress.    Appearance: She is well-developed.  Cardiovascular:     Rate and Rhythm: Normal rate and regular rhythm.  Pulmonary:     Breath  sounds: Normal breath sounds.  Musculoskeletal:        General: Normal range of motion.  Skin:    General: Skin is warm and dry.  Neurological:     Mental Status: She is alert and oriented to person, place, and time.    Results for orders placed or performed in visit on 12/19/22  CBC with Differential/Platelet  Result Value Ref Range   WBC 6.0 3.4 - 10.8 x10E3/uL   RBC 4.57 3.77 - 5.28 x10E6/uL   Hemoglobin 13.5 11.1 - 15.9 g/dL   Hematocrit 40.9 81.1 - 46.6 %   MCV 91 79 - 97 fL   MCH 29.5 26.6 - 33.0 pg   MCHC 32.5 31.5 - 35.7 g/dL   RDW 91.4 78.2 - 95.6 %   Platelets 268 150 - 450 x10E3/uL   Neutrophils 60 Not Estab. %   Lymphs 29 Not Estab. %   Monocytes 8 Not Estab. %   Eos 2 Not Estab. %   Basos 1 Not Estab. %   Neutrophils Absolute 3.6 1.4 - 7.0 x10E3/uL   Lymphocytes Absolute 1.7 0.7 - 3.1 x10E3/uL   Monocytes Absolute 0.5 0.1 - 0.9 x10E3/uL   EOS (ABSOLUTE) 0.1 0.0 - 0.4 x10E3/uL   Basophils Absolute 0.0 0.0 - 0.2 x10E3/uL   Immature Granulocytes 0 Not Estab. %   Immature Grans (Abs) 0.0 0.0 - 0.1 x10E3/uL  CMP14+EGFR  Result Value Ref Range   Glucose 106 (H) 70 - 99 mg/dL   BUN 8 8 - 27 mg/dL   Creatinine, Ser 2.13 0.57 - 1.00 mg/dL   eGFR 76 >08 MV/HQI/6.96   BUN/Creatinine Ratio 10 (L) 12 - 28   Sodium 142 134 - 144 mmol/L   Potassium 4.3 3.5 - 5.2 mmol/L   Chloride 105 96 - 106 mmol/L   CO2 23 20 - 29 mmol/L   Calcium 9.1 8.7 - 10.3 mg/dL   Total Protein 6.9 6.0 - 8.5 g/dL   Albumin 4.2 3.9 - 4.9 g/dL   Globulin, Total 2.7 1.5 - 4.5 g/dL   Bilirubin Total 0.4 0.0 - 1.2 mg/dL   Alkaline Phosphatase 112 44 - 121 IU/L   AST 19 0 - 40 IU/L   ALT 14 0 - 32 IU/L  Lipid panel  Result Value Ref Range   Cholesterol, Total 200 (H) 100 - 199 mg/dL   Triglycerides 295 0 - 149 mg/dL   HDL 46 >28 mg/dL   VLDL Cholesterol Cal 23 5 - 40 mg/dL   LDL Chol Calc (NIH) 413 (H) 0 - 99 mg/dL   Chol/HDL Ratio 4.3 0.0 - 4.4 ratio     Assessment & Plan:   Diagnoses  and all orders for this visit:  Dyslipidemia -     Cancel: Lipid panel  Primary hypertension -     Cancel: CBC with Differential/Platelet -     Cancel: CMP14+EGFR  Age-related osteoporosis without current pathological fracture  Vitamin D deficiency -     VITAMIN D 25 Hydroxy (Vit-D Deficiency, Fractures)  Essential tremor  Other orders -     denosumab (PROLIA) 60 MG/ML SOSY injection; Inject 60 mg into the skin once for 1 dose. -     simvastatin (ZOCOR) 20 MG tablet; Take 1 tablet (20 mg total) by mouth daily at 6 PM. -     trimethoprim-polymyxin b (POLYTRIM) ophthalmic solution; USE 1 DROP IN RIGHT EYE 4 TIMES DAILY FOR 2 DAYS AFTER EACH MONTHLY EYE INJECTION   I am having Avrianna B. Bulow start on denosumab. I am also having her maintain her diphenhydrAMINE, cyanocobalamin, Fish Oil, Calcium Carbonate (CALCIUM 600 PO), alendronate, Vitamin D (Ergocalciferol), albuterol, propranolol ER, citalopram, simvastatin, and trimethoprim-polymyxin b.  Meds ordered this encounter  Medications   denosumab (PROLIA) 60 MG/ML SOSY injection    Sig: Inject 60 mg into the skin once for 1 dose.    Dispense:  1 mL    Refill:  0   simvastatin (ZOCOR) 20 MG tablet    Sig: Take 1 tablet (20 mg total) by mouth daily at 6 PM.    Dispense:  90 tablet    Refill:  3   trimethoprim-polymyxin b (POLYTRIM) ophthalmic solution    Sig: USE 1 DROP IN RIGHT EYE 4 TIMES DAILY FOR 2 DAYS AFTER EACH MONTHLY EYE INJECTION    Dispense:  10 mL    Refill:  11     Follow-up: Return in about 6 months (around 07/15/2023).  Mechele Claude, M.D.

## 2023-01-14 NOTE — Addendum Note (Signed)
Addended by: Diamantina Monks on: 01/14/2023 06:43 PM   Modules accepted: Orders

## 2023-01-15 LAB — VITAMIN D 25 HYDROXY (VIT D DEFICIENCY, FRACTURES): Vit D, 25-Hydroxy: 22.2 ng/mL — ABNORMAL LOW (ref 30.0–100.0)

## 2023-01-18 NOTE — Progress Notes (Signed)
Dear Rachael Bryant, Your Vitamin D is  low. You need a prescription strength supplement I will send that in for you. Nurse, if at all possible, could you send in a prescription for the patient for vitamin D 50,000 units, 1 p.o. weekly #13 with 3 refills? Many thanks, WS

## 2023-01-20 ENCOUNTER — Encounter: Payer: Self-pay | Admitting: *Deleted

## 2023-01-21 ENCOUNTER — Telehealth: Payer: Self-pay

## 2023-01-21 ENCOUNTER — Telehealth: Payer: Self-pay | Admitting: *Deleted

## 2023-01-21 DIAGNOSIS — M810 Age-related osteoporosis without current pathological fracture: Secondary | ICD-10-CM

## 2023-01-21 MED ORDER — DENOSUMAB 60 MG/ML ~~LOC~~ SOSY: 60.0000 mg | PREFILLED_SYRINGE | SUBCUTANEOUS | 0 refills | Status: DC

## 2023-01-21 MED ORDER — DENOSUMAB 60 MG/ML ~~LOC~~ SOSY: 60.0000 mg | PREFILLED_SYRINGE | Freq: Once | SUBCUTANEOUS | Status: DC

## 2023-01-21 NOTE — Telephone Encounter (Signed)
Copied from CRM [id number]. Topic: Clinical - Medication Refill >> Jan 21, 2023  1:33 PM Tiffany H wrote: Most Recent Primary Care Visit:  Provider: Mechele Claude  Department: [facility]  Visit Type: OFFICE VISIT  Date: 01/14/2023  Medication: [facility] Pharm[facility] Technician [facility]  Phone: [phone] Fax: [phone]  Regarding: Prolia Injection 60mg .   Dorathy Daft advised that prescription sent to [facility] was missing a dosage frequency. Please update prescription and resent.   Dorathy Daft advised that shots are typically administered once every six months.   Has the patient contacted their pharmacy? Yes (Agent: If no, request that the patient contact the pharmacy for the refill. If patient does not wish to contact the pharmacy document the reason why and proceed with request.) (Agent: If yes, when and what did the pharmacy advise?)  Phone: [phone] Fax: [phone]  Is this the correct pharmacy for this prescription? Yes If no, delete pharmacy and type the correct one.  This is the patient's preferred pharmacy:      Has the prescription been filled recently? No  Is the patient out of the medication? Yes  Has the patient been seen for an appointment in the last year OR does the patient have an upcoming appointment? Yes  Can we respond through MyChart? No  Agent: Please be advised that Rx refills may take up to 3 business days. We ask that you follow-up with your pharmacy.    New prolia start

## 2023-01-21 NOTE — Telephone Encounter (Signed)
Prolia ordered for PA team to review verification benefits

## 2023-02-09 ENCOUNTER — Ambulatory Visit: Payer: Medicare Other

## 2023-02-09 VITALS — Ht 67.0 in | Wt 182.0 lb

## 2023-02-09 DIAGNOSIS — Z780 Asymptomatic menopausal state: Secondary | ICD-10-CM

## 2023-02-09 DIAGNOSIS — Z0000 Encounter for general adult medical examination without abnormal findings: Secondary | ICD-10-CM

## 2023-02-09 NOTE — Progress Notes (Signed)
Subjective:   Rachael Bryant is a 72 y.o. female who presents for Medicare Annual (Subsequent) preventive examination.  Visit Complete: Virtual I connected with  LESLIE DIMEO on 02/09/23 by a audio enabled telemedicine application and verified that I am speaking with the correct person using two identifiers.  Patient Location: Home  Provider Location: Home Office  I discussed the limitations of evaluation and management by telemedicine. The patient expressed understanding and agreed to proceed.  Vital Signs: Because this visit was a virtual/telehealth visit, some criteria may be missing or patient reported. Any vitals not documented were not able to be obtained and vitals that have been documented are patient reported.  Cardiac Risk Factors include: advanced age (>24men, >71 women);dyslipidemia     Objective:    Today's Vitals   02/09/23 0801  Weight: 182 lb (82.6 kg)  Height: 5\' 7"  (1.702 m)   Body mass index is 28.51 kg/m.     02/09/2023    9:09 AM 02/07/2022    8:19 AM 02/06/2021    3:31 PM 01/15/2021    9:51 AM 01/14/2021   10:18 AM  Advanced Directives  Does Patient Have a Medical Advance Directive? No No No No No  Would patient like information on creating a medical advance directive? Yes (MAU/Ambulatory/Procedural Areas - Information given) No - Patient declined No - Patient declined  No - Patient declined    Current Medications (verified) Outpatient Encounter Medications as of 02/09/2023  Medication Sig   albuterol (VENTOLIN HFA) 108 (90 Base) MCG/ACT inhaler Inhale 2 puffs into the lungs every 6 (six) hours as needed for wheezing or shortness of breath.   alendronate (FOSAMAX) 70 MG tablet Take 1 tablet (70 mg total) by mouth once a week.   Calcium Carbonate (CALCIUM 600 PO) Take by mouth. Takes 3 -4 a day   citalopram (CELEXA) 10 MG tablet Take 1 tablet (10 mg total) by mouth daily.   cyanocobalamin 1000 MCG tablet Take 1,000 mcg by mouth daily.    denosumab (PROLIA) 60 MG/ML SOSY injection Inject 60 mg into the skin every 6 (six) months.   diphenhydrAMINE (BENADRYL) 25 mg capsule Take 50 mg by mouth at bedtime.   Omega-3 Fatty Acids (FISH OIL) 1200 MG CAPS Take by mouth.   propranolol ER (INDERAL LA) 120 MG 24 hr capsule TAKE 1 CAPSULE BY MOUTH EVERY DAY   simvastatin (ZOCOR) 20 MG tablet Take 1 tablet (20 mg total) by mouth daily at 6 PM.   trimethoprim-polymyxin b (POLYTRIM) ophthalmic solution USE 1 DROP IN RIGHT EYE 4 TIMES DAILY FOR 2 DAYS AFTER EACH MONTHLY EYE INJECTION   Vitamin D, Ergocalciferol, (DRISDOL) 1.25 MG (50000 UNIT) CAPS capsule Take 1 capsule (50,000 Units total) by mouth every 7 (seven) days.   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   Left Alar Crease   PONV (postoperative nausea and vomiting)    Past Surgical History:  Procedure Laterality Date   COLONOSCOPY WITH PROPOFOL N/A 01/15/2021   Procedure: COLONOSCOPY WITH PROPOFOL;  Surgeon: Dolores Frame, MD;  [facility];  Service: Gastroenterology;  Laterality: N/A;  11:15   POLYPECTOMY  01/15/2021   Procedure: POLYPECTOMY;  Surgeon: Dolores Frame, MD;  Location:  [facility];  Service: Gastroenterology;;   TUBAL LIGATION     Family History  Problem Relation Age of Onset   Heart attack Father    Diabetes Mother    Pulmonary fibrosis Mother    Parkinsonism Sister    Social History   Socioeconomic History   Marital status: Married    Spouse name: Viviann Spare    Number of children: 2   Years of education: 12   Highest education level: Not on file  Occupational History   Occupation: retired  Tobacco Use   Smoking status: Never   Smokeless tobacco:  Never  Vaping Use   Vaping status: Never Used  Substance and Sexual Activity   Alcohol use: No    Alcohol/week: 0.0 standard drinks of alcohol   Drug use: No   Sexual activity: Not on file  Other Topics Concern   Not on file  Social History Narrative   Lives at home with husband, Viviann Spare.   Caffeine use: Drinks coffee/soda (7 cups per week)   Drinks 2 Liters per week   Children live nearby   3 grandchildren, 2 great-grandchildren   Social Drivers of Health   Financial Resource Strain: Low Risk  (02/09/2023)   Overall Financial Resource Strain (CARDIA)    Difficulty of Paying Living Expenses: Not hard at all  Food Insecurity: No Food Insecurity (02/09/2023)   Hunger Vital Sign    Worried About Running Out of Food in the Last Year: Never true    Ran Out of Food in the Last Year: Never true  Transportation Needs: No Transportation Needs (02/09/2023)   PRAPARE - Administrator, Civil Service (Medical): No    Lack of Transportation (Non-Medical): No  Physical Activity: Sufficiently Active (02/09/2023)   Exercise Vital Sign    Days of Exercise per Week: 5 days    Minutes of Exercise per Session: 30 min  Stress: No Stress Concern Present (02/09/2023)   [employer] of Occupational Health - Occupational Stress Questionnaire    Feeling of Stress : Only a little  Social Connections: Moderately Integrated (02/09/2023)   Social Connection and Isolation Panel [NHANES]    Frequency of Communication with Friends and Family: More than three times a week    Frequency of Social Gatherings with Friends and Family: Three times a week    Attends Religious Services: More than 4 times per year    Active Member of Clubs or Organizations: No    Attends Banker Meetings: Never    Marital Status: Married    Tobacco Counseling Counseling given: Not Answered   Clinical Intake:  Pre-visit preparation completed: Yes  Pain : No/denies pain     Diabetes:  No  How often do you need to have someone help you when you read instructions, pamphlets, or other written materials from your doctor or pharmacy?: 1 - Never  Interpreter Needed?: No  Information entered by :: Kandis Fantasia LPN   Activities of Daily Living    02/09/2023    9:08 AM  In your present state of health, do you have any difficulty performing the following activities:  Hearing? 0  Vision? 0  Difficulty concentrating or making decisions? 0  Walking or climbing stairs? 0  Dressing or bathing? 0  Doing errands, shopping? 0  Preparing Food and eating ? N  Using the Toilet? N  In the past six months, have you accidently leaked urine? N  Do you have problems with loss of bowel  control? N  Managing your Medications? N  Managing your Finances? N  Housekeeping or managing your Housekeeping? N    Patient Care Team: Mechele Claude, MD as PCP - General (Family Medicine)  Indicate any recent Medical Services you may have received from other than [facility] providers in the past year (date may be approximate).     Assessment:   This is a routine wellness examination for Rachael Bryant.  Hearing/Vision screen Hearing Screening - Comments:: Denies hearing difficulties   Vision Screening - Comments:: Wears rx glasses - up to date with routine eye exams with [facility]     Goals Addressed             This Visit's Progress    COMPLETED: Patient Stated       Wants to be independent, stay healthy, happy, and spend plenty of time with grandchildren     Remain active and independent        Depression Screen    02/09/2023    9:04 AM 04/16/2022    2:33 PM 02/07/2022    8:18 AM 10/02/2021    2:25 PM 02/13/2021    2:58 PM 02/06/2021    3:30 PM 09/04/2020    8:12 AM  PHQ 2/9 Scores  PHQ - 2 Score 0 0 0 0 0 0 0  PHQ- 9 Score     0      Fall Risk    02/09/2023    9:08 AM 04/16/2022    2:33 PM 02/07/2022    8:16 AM 10/02/2021    2:25 PM 02/13/2021    2:58 PM  Fall Risk   Falls  in the past year? 0 0 0 0 0  Number falls in past yr: 0  0    Injury with Fall? 0  0    Risk for fall due to : No Fall Risks  No Fall Risks    Follow up Falls prevention discussed;Education provided;Falls evaluation completed  Falls prevention discussed      MEDICARE RISK AT HOME: Medicare Risk at Home Any stairs in or around the home?: No If so, are there any without handrails?: No Home free of loose throw rugs in walkways, pet beds, electrical cords, etc?: Yes Adequate lighting in your home to reduce risk of falls?: Yes Life alert?: No Use of a cane, walker or w/c?: No Grab bars in the bathroom?: Yes Shower chair or bench in shower?: No Elevated toilet seat or a handicapped toilet?: Yes  TIMED UP AND GO:  Was the test performed?  No    Cognitive Function:        02/09/2023    9:09 AM 02/07/2022    8:19 AM 02/06/2021    3:38 PM  6CIT Screen  What Year? 0 points 0 points 0 points  What month? 0 points 0 points 0 points  What time? 0 points 0 points 0 points  Count back from 20 0 points 0 points 0 points  Months in reverse 0 points 0 points 0 points  Repeat phrase 0 points 0 points 0 points  Total Score 0 points 0 points 0 points    Immunizations Immunization History  Administered Date(s) Administered   Fluad Quad(high Dose 65+) 12/14/2018, 11/04/2019   Fluad Trivalent(High Dose 65+) 01/14/2023   Influenza Inj Mdck Quad Pf 11/17/2016   Influenza Split 10/03/2014, 11/19/2016   Influenza, High Dose Seasonal PF 01/15/2018   Moderna Sars-Covid-2 Vaccination 03/22/2019, 04/19/2019   Pneumococcal Conjugate-13 01/11/2018  Pneumococcal Polysaccharide-23 04/30/2020   Tdap 02/27/2015    TDAP status: Up to date  Flu Vaccine status: Up to date  Pneumococcal vaccine status: Up to date  Covid-19 vaccine status: Information provided on how to obtain vaccines.   Qualifies for Shingles Vaccine? Yes   Zostavax completed No   Shingrix Completed?: No.    Education has  been provided regarding the importance of this vaccine. Patient has been advised to call insurance company to determine out of pocket expense if they have not yet received this vaccine. Advised may also receive vaccine at local pharmacy or Health Dept. Verbalized acceptance and understanding.  Screening Tests Health Maintenance  Topic Date Due   COVID-19 Vaccine (3 - Moderna risk series) 05/17/2019   DEXA SCAN  10/31/2021   MAMMOGRAM  10/08/2022   Zoster Vaccines- Shingrix (1 of 2) 04/14/2023 (Originally 10/05/1969)   Medicare Annual Wellness (AWV)  02/09/2024   DTaP/Tdap/Td (2 - Td or Tdap) 02/26/2025   Colonoscopy  01/16/2028   Pneumonia Vaccine 92+ Years old  Completed   INFLUENZA VACCINE  Completed   Hepatitis C Screening  Completed   HPV VACCINES  Aged Out    Health Maintenance  Health Maintenance Due  Topic Date Due   COVID-19 Vaccine (3 - Moderna risk series) 05/17/2019   DEXA SCAN  10/31/2021   MAMMOGRAM  10/08/2022    Colorectal cancer screening: Type of screening: Colonoscopy. Completed 01/15/21. Repeat every 7 years  Mammogram status:  Patient would like to postpone at this time   Bone Density status: Ordered today. Pt provided with contact info and advised to call to schedule appt.  Lung Cancer Screening: (Low Dose CT Chest recommended if Age 63-80 years, 20 pack-year currently smoking OR have quit w/in 15years.) does not qualify.   Lung Cancer Screening Referral: n/a  Additional Screening:  Hepatitis C Screening: does qualify; Completed 04/30/20  Vision Screening: Recommended annual ophthalmology exams for early detection of glaucoma and other disorders of the eye. Is the patient up to date with their annual eye exam?  Yes  Who is the provider or what is the name of the office in which the patient attends annual eye exams? [facility]  If pt is not established with a provider, would they like to be referred to a provider to establish care? No .   Dental  Screening: Recommended annual dental exams for proper oral hygiene  Community Resource Referral / Chronic Care Management: CRR required this visit?  No   CCM required this visit?  No     Plan:     I have personally reviewed and noted the following in the patient's chart:   Medical and social history Use of alcohol, tobacco or illicit drugs  Current medications and supplements including opioid prescriptions. Patient is not currently taking opioid prescriptions. Functional ability and status Nutritional status Physical activity Advanced directives List of other physicians Hospitalizations, surgeries, and ER visits in previous 12 months Vitals Screenings to include cognitive, depression, and falls Referrals and appointments  In addition, I have reviewed and discussed with patient certain preventive protocols, quality metrics, and best practice recommendations. A written personalized care plan for preventive services as well as general preventive health recommendations were provided to patient.     Kandis Fantasia [place], [place]   16/11/9602   After Visit Summary: (Mail) Due to this being a telephonic visit, the after visit summary with patients personalized plan was offered to patient via mail   Nurse Notes:  No concerns at this time

## 2023-02-09 NOTE — Addendum Note (Signed)
Addended by: Kandis Fantasia B on: 02/09/2023 09:46 AM   Modules accepted: Orders

## 2023-02-09 NOTE — Patient Instructions (Signed)
Ms. Marmer , Thank you for taking time to come for your Medicare Wellness Visit. I appreciate your ongoing commitment to your health goals. Please review the following plan we discussed and let me know if I can assist you in the future.   Referrals/Orders/Follow-Ups/Clinician Recommendations: Aim for 30 minutes of exercise or brisk walking, 6-8 glasses of water, and 5 servings of fruits and vegetables each day.  This is a list of the screening recommended for you and due dates:  Health Maintenance  Topic Date Due   COVID-19 Vaccine (3 - Moderna risk series) 05/17/2019   DEXA scan (bone density measurement)  10/31/2021   Mammogram  10/08/2022   Zoster (Shingles) Vaccine (1 of 2) 04/14/2023*   Medicare Annual Wellness Visit  02/09/2024   DTaP/Tdap/Td vaccine (2 - Td or Tdap) 02/26/2025   Colon Cancer Screening  01/16/2028   Pneumonia Vaccine  Completed   Flu Shot  Completed   Hepatitis C Screening  Completed   HPV Vaccine  Aged Out  *Topic was postponed. The date shown is not the original due date.    Advanced directives: (ACP Link)Information on Advanced Care Planning can be found at [facility] Advance Health Care Directives Advance Health Care Directives ([email or web address]   Next Medicare Annual Wellness Visit scheduled for next year: Yes

## 2023-02-16 ENCOUNTER — Telehealth: Payer: Self-pay

## 2023-02-16 NOTE — Telephone Encounter (Signed)
 Prolia VOB initiated via [email or web address]

## 2023-02-17 ENCOUNTER — Encounter (INDEPENDENT_AMBULATORY_CARE_PROVIDER_SITE_OTHER): Payer: Medicare Other | Admitting: Ophthalmology

## 2023-02-17 DIAGNOSIS — H43813 Vitreous degeneration, bilateral: Secondary | ICD-10-CM | POA: Diagnosis not present

## 2023-02-17 DIAGNOSIS — H353121 Nonexudative age-related macular degeneration, left eye, early dry stage: Secondary | ICD-10-CM

## 2023-02-17 DIAGNOSIS — H353211 Exudative age-related macular degeneration, right eye, with active choroidal neovascularization: Secondary | ICD-10-CM | POA: Diagnosis not present

## 2023-02-17 NOTE — Telephone Encounter (Signed)
 Marland Kitchen

## 2023-02-17 NOTE — Telephone Encounter (Signed)
 Pharmacy Patient Advocate Encounter   Received notification from  [employer] Portal that prior authorization for Prolia  is required/requested.   Insurance verification completed.   The patient is insured through  [employer]  .   Per test claim: PA required; PA submitted to above mentioned insurance via [facility] Portal Key/confirmation #/EOC [id number] Status is pending

## 2023-02-23 NOTE — Telephone Encounter (Signed)
 Pharmacy Patient Advocate Encounter  Received notification from  [facility]  that Prior Authorization for Prolia has been DENIED.  Full denial letter will be uploaded to the media tab. See denial reason below.   PA #/Case ID/Reference #: [id number]

## 2023-03-09 ENCOUNTER — Other Ambulatory Visit (HOSPITAL_COMMUNITY): Payer: Self-pay

## 2023-03-16 ENCOUNTER — Other Ambulatory Visit (HOSPITAL_COMMUNITY): Payer: Self-pay

## 2023-03-16 NOTE — Telephone Encounter (Addendum)
Per insurance, patient must fail both an oral and IV biphosphate. Please provide specific medical reason why patient cannot take IV biphosphate to proceed with appeal.

## 2023-03-17 ENCOUNTER — Encounter (INDEPENDENT_AMBULATORY_CARE_PROVIDER_SITE_OTHER): Payer: Medicare Other | Admitting: Ophthalmology

## 2023-03-17 DIAGNOSIS — H353121 Nonexudative age-related macular degeneration, left eye, early dry stage: Secondary | ICD-10-CM

## 2023-03-17 DIAGNOSIS — H43813 Vitreous degeneration, bilateral: Secondary | ICD-10-CM | POA: Diagnosis not present

## 2023-03-17 DIAGNOSIS — H353211 Exudative age-related macular degeneration, right eye, with active choroidal neovascularization: Secondary | ICD-10-CM | POA: Diagnosis not present

## 2023-03-23 ENCOUNTER — Ambulatory Visit (INDEPENDENT_AMBULATORY_CARE_PROVIDER_SITE_OTHER): Payer: Medicare Other | Admitting: Family Medicine

## 2023-03-23 ENCOUNTER — Encounter: Payer: Self-pay | Admitting: Family Medicine

## 2023-03-23 VITALS — BP 145/79 | HR 69 | Temp 97.1°F | Ht 67.0 in | Wt 181.0 lb

## 2023-03-23 DIAGNOSIS — M810 Age-related osteoporosis without current pathological fracture: Secondary | ICD-10-CM | POA: Diagnosis not present

## 2023-03-23 DIAGNOSIS — S46912A Strain of unspecified muscle, fascia and tendon at shoulder and upper arm level, left arm, initial encounter: Secondary | ICD-10-CM

## 2023-03-23 MED ORDER — PREDNISONE 20 MG PO TABS: 20.0000 mg | ORAL_TABLET | Freq: Two times a day (BID) | ORAL | 0 refills | Status: AC

## 2023-03-23 NOTE — Progress Notes (Signed)
refer

## 2023-03-27 MED ORDER — RISEDRONATE SODIUM 150 MG PO TABS: 150.0000 mg | ORAL_TABLET | ORAL | 3 refills | Status: DC

## 2023-03-27 NOTE — Progress Notes (Signed)
Subjective:  Patient ID: Rachael Bryant, female    DOB: March 08, 1950  Age: 73 y.o. MRN: [id number]  CC: Referral (Referral to orthopedic/Left shoulder pain. Pain shoots down a few inches. Can't lift arm far. Ongoing for years. /Dr. August Saucer in [place].)   HPI [name] presents for pain in the left shoulder. She hurt it lifting her yung grandson. She has had no relief since. She knows Dr. August Saucer with ortho and wants to see him. Celebrex not helping.  Pt. Had xs side effects with alendronate and insurance won't cover prolia. Wants alternative. No fx, but has had abnorma DEXA.       03/23/2023   10:12 AM 02/09/2023    9:04 AM 04/16/2022    2:33 PM  Depression screen PHQ 2/9  Decreased Interest 0 0 0  Down, Depressed, Hopeless 0 0 0  PHQ - 2 Score 0 0 0  Altered sleeping 0    Tired, decreased energy 0    Change in appetite 0    Feeling bad or failure about yourself  0    Trouble concentrating 0    Moving slowly or fidgety/restless 0    Suicidal thoughts 0    PHQ-9 Score 0    Difficult doing work/chores Not difficult at all      History Tien has a past medical history of Anxiety, High cholesterol, Nodular 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   She has a past surgical history that includes Tubal ligation; Colonoscopy with propofol (N/A, 01/15/2021); and polypectomy (01/15/2021).   Her family history includes Diabetes in her mother; Heart attack in her father; Parkinsonism in her sister; Pulmonary fibrosis in her mother.She reports that she has never smoked. She has never used smokeless tobacco. She reports that she does not drink alcohol and does not use drugs.    ROS Review of Systems  Constitutional: Negative.   HENT: Negative.    Eyes:  Negative for visual disturbance.  Respiratory:  Negative for shortness of breath.   Cardiovascular:  Negative for chest pain.  Gastrointestinal:  Negative for abdominal pain.   Musculoskeletal:  Positive for arthralgias.    Objective:  BP (!) 145/79   Pulse 69   Temp (!) 97.1 F (36.2 C)   Ht 5\' 7"  (1.702 m)   Wt 181 lb (82.1 kg)   SpO2 97%   BMI 28.35 kg/m   BP Readings from Last 3 Encounters:  03/23/23 (!) 145/79  01/14/23 123/68  04/16/22 (!) 158/79    Wt Readings from Last 3 Encounters:  03/23/23 181 lb (82.1 kg)  02/09/23 182 lb (82.6 kg)  01/14/23 182 lb (82.6 kg)     Physical Exam Constitutional:      General: She is not in acute distress.    Appearance: She is well-developed.  Cardiovascular:     Rate and Rhythm: Normal rate and regular rhythm.  Pulmonary:     Breath sounds: Normal breath sounds.  Musculoskeletal:        General: Tenderness (ball of left shoulder.) present.     Comments: Abduction left shoulder reduced to 20 degrees.   Skin:    General: Skin is warm and dry.  Neurological:     Mental Status: She is alert and oriented to person, place, and time.       Assessment & Plan:   Dustie was seen today for referral.  Diagnoses and all orders for this visit:  Strain of left shoulder, initial  encounter -     predniSONE (DELTASONE) 20 MG tablet; Take 1 tablet (20 mg total) by mouth 2 (two) times daily with a meal for 10 doses. -     Ambulatory referral to Orthopedics  Age-related osteoporosis without current pathological fracture -     risedronate (ACTONEL) 150 MG tablet; Take 1 tablet (150 mg total) by mouth every 30 (thirty) days. with water on empty stomach, nothing by mouth or lie down for next 30 minutes.       I have discontinued Klaryssa B. Keehan's alendronate and denosumab. I am also having her start on predniSONE and risedronate. Additionally, I am having her maintain her diphenhydrAMINE, cyanocobalamin, Fish Oil, Calcium Carbonate (CALCIUM 600 PO), Vitamin D (Ergocalciferol), albuterol, propranolol ER, citalopram, simvastatin, and trimethoprim-polymyxin b. We will continue to administer  denosumab.  Allergies as of 03/23/2023       Reactions   Naproxen Sodium Hives   Ibuprofen Hives   Other Hives   NSAIDS   Codeine Nausea Only        Medication List        Accurate as of March 23, 2023 11:59 PM. If you have any questions, ask your nurse or doctor.          STOP taking these medications    alendronate 70 MG tablet Commonly known as: FOSAMAX Stopped by: Broadus John Mykela Mewborn   denosumab 60 MG/ML Sosy injection Commonly known as: Secretary/administrator by: Cantrell Martus       TAKE these medications    albuterol 108 (90 Base) MCG/ACT inhaler Commonly known as: VENTOLIN HFA Inhale 2 puffs into the lungs every 6 (six) hours as needed for wheezing or shortness of breath.   CALCIUM 600 PO Take by mouth. Takes 3 -4 a day   citalopram 10 MG tablet Commonly known as: CELEXA Take 1 tablet (10 mg total) by mouth daily.   cyanocobalamin 1000 MCG tablet Take 1,000 mcg by mouth daily.   diphenhydrAMINE 25 mg capsule Commonly known as: BENADRYL Take 50 mg by mouth at bedtime.   Fish Oil 1200 MG Caps Take by mouth.   predniSONE 20 MG tablet Commonly known as: DELTASONE Take 1 tablet (20 mg total) by mouth 2 (two) times daily with a meal for 10 doses. Started by: Jacora Hopkins   propranolol ER 120 MG 24 hr capsule Commonly known as: INDERAL LA TAKE 1 CAPSULE BY MOUTH EVERY DAY   risedronate 150 MG tablet Commonly known as: ACTONEL Take 1 tablet (150 mg total) by mouth every 30 (thirty) days. with water on empty stomach, nothing by mouth or lie down for next 30 minutes. Started by: Evely Gainey   simvastatin 20 MG tablet Commonly known as: ZOCOR Take 1 tablet (20 mg total) by mouth daily at 6 PM.   trimethoprim-polymyxin b ophthalmic solution Commonly known as: POLYTRIM USE 1 DROP IN RIGHT EYE 4 TIMES DAILY FOR 2 DAYS AFTER EACH MONTHLY EYE INJECTION   Vitamin D (Ergocalciferol) 1.25 MG (50000 UNIT) Caps capsule Commonly known as: DRISDOL Take 1  capsule (50,000 Units total) by mouth every 7 (seven) days.         Follow-up: No follow-ups on file.  Mechele Claude, M.D.

## 2023-04-07 ENCOUNTER — Other Ambulatory Visit: Payer: Self-pay | Admitting: Family Medicine

## 2023-04-07 DIAGNOSIS — G250 Essential tremor: Secondary | ICD-10-CM

## 2023-04-14 ENCOUNTER — Encounter (INDEPENDENT_AMBULATORY_CARE_PROVIDER_SITE_OTHER): Payer: Medicare Other | Admitting: Ophthalmology

## 2023-04-14 DIAGNOSIS — H43813 Vitreous degeneration, bilateral: Secondary | ICD-10-CM

## 2023-04-14 DIAGNOSIS — H353121 Nonexudative age-related macular degeneration, left eye, early dry stage: Secondary | ICD-10-CM

## 2023-04-14 DIAGNOSIS — H353211 Exudative age-related macular degeneration, right eye, with active choroidal neovascularization: Secondary | ICD-10-CM

## 2023-04-16 ENCOUNTER — Other Ambulatory Visit: Payer: Self-pay

## 2023-04-16 ENCOUNTER — Ambulatory Visit: Payer: Medicare Other | Admitting: Orthopedic Surgery

## 2023-04-16 ENCOUNTER — Other Ambulatory Visit (INDEPENDENT_AMBULATORY_CARE_PROVIDER_SITE_OTHER): Payer: Self-pay

## 2023-04-16 DIAGNOSIS — M19012 Primary osteoarthritis, left shoulder: Secondary | ICD-10-CM | POA: Diagnosis not present

## 2023-04-16 DIAGNOSIS — M25512 Pain in left shoulder: Secondary | ICD-10-CM

## 2023-04-17 ENCOUNTER — Encounter: Payer: Self-pay | Admitting: Orthopedic Surgery

## 2023-04-17 NOTE — Progress Notes (Signed)
 Office Visit Note   Patient: Rachael Bryant           Date of Birth: [birth year]           MRN: [id number] Visit Date: 04/16/2023 Requested by: Mechele Claude, MD [address] [place],  [place] [address] PCP: Mechele Claude, MD  Subjective: Chief Complaint  Patient presents with   Left Shoulder - Pain    HPI: Rachael Bryant is a 73 y.o. female who presents to the office reporting left shoulder pain worse in the last year.  Describes decreased range of motion.  Patient is left-hand dominant.  Denies any history of injury.  Difficult for her to lift her arm.  Pain has been going on for approximately 10 years.  Denies any numbness and tingling or neck symptoms.  No prior left shoulder surgery.  Had an injection over 10 years ago which gave her 2 or 3 months from relief.  She is retired.  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  Pain does wake her from sleep.  She cannot sleep on the left-hand side..                ROS: All systems reviewed are negative as they relate to the chief complaint within the history of present illness.  Patient denies fevers or chills.  Assessment & Plan: Visit Diagnoses:  1. 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  I think that he is a pretty reasonable candidate for shoulder replacement and her symptoms get to the point she does not want to deal with them anymore.  For now we will try a glenohumeral joint injection and see her back in 2 months for clinical recheck.  Decision for or against shoulder replacement at that time.  Follow-Up Instructions: No follow-ups on file.   Orders:  Orders Placed This Encounter  Procedures   XR Shoulder Left   US Guided Needle Placement - No Linked Charges   No orders of the defined types were placed in this encounter.     Procedures: Large Joint Inj: L glenohumeral on 04/16/2023 6:16 PM Indications:  diagnostic evaluation and pain Details: 22 G 3.5 in needle, ultrasound-guided posterior approach  Arthrogram: No  Medications: 9 mL bupivacaine 0.5 %; 40 mg methylPREDNISolone acetate 40 MG/ML; 5 mL lidocaine 1 % Outcome: tolerated well, no immediate complications Procedure, treatment alternatives, risks and benefits explained, specific risks discussed. Consent was given by the patient. Immediately prior to procedure a time out was called to verify the correct patient, procedure, equipment, support staff and site/side marked as required. Patient was prepped and draped in the usual sterile fashion.       Clinical Data: No additional findings.  Objective: Vital Signs: There were no vitals taken for this visit.  Physical Exam:  Constitutional: Patient appears well-developed HEENT:  Head: Normocephalic Eyes:EOM are normal Neck: Normal range of motion Cardiovascular: Normal rate Pulmonary/chest: Effort normal Neurologic: Patient is alert Skin: Skin is warm Psychiatric: Patient has normal mood and affect  Ortho Exam: Ortho exam demonstrates range of motion on the right of 45/120/180.  Range of motion on the left is 10/50/110.  Pretty good rotator cuff strength to subscap and posterior superior rotator cuff testing bilaterally.  No Popeye deformity.  Axillary nerve is functional.  Coarseness is present with passive range of motion of the left shoulder.  Cervical spine range of  motion intact. Patient has bilateral 5 out of 5 grip EPL FPL interosseous wrist flexion wrist extension bicep triceps and deltoid strength.  Bilateral palpable radial pulses and no paresthesias C5-T1 in either arm.  Neck range of motion flexion chin to chest with extension approximately 50 degrees with approximately 50 degrees of rotation bilaterally.  No masses lymphadenopathy or skin changes around the neck or shoulder girdle region bilaterally   Specialty Comments:  No specialty comments  available.  Imaging: No results found.   PMFS History: Patient Active Problem List   Diagnosis Date Noted   Vitamin D deficiency 10/02/2021   Change in bowel movement 01/10/2021   Acute bilateral low back pain without sciatica 07/03/2020   Age-related osteoporosis without current pathological fracture 02/25/2018   Menopausal syndrome 01/11/2018   Elevated hemoglobin A1c 11/27/2014   Essential tremor 09/04/2014   Insomnia 09/04/2014   Anxiety state 09/04/2014   Depression 09/04/2014   Dyslipidemia 05/01/2014   Upper respiratory infection, viral 01/30/2014   Anxiety, generalized 01/02/2014   Past Medical History:  Diagnosis Date   Anxiety    High cholesterol    Nodular basal cell carcinoma (BCC) 05/02/2020   Left Alar Crease   PONV (postoperative nausea and vomiting)     Family History  Problem Relation Age of Onset   Heart attack Father    Diabetes Mother    Pulmonary fibrosis Mother    Parkinsonism Sister     Past Surgical History:  Procedure Laterality Date   COLONOSCOPY WITH PROPOFOL N/A 01/15/2021   Procedure: COLONOSCOPY WITH PROPOFOL;  Surgeon: Dolores Frame, MD;  Location: [facility];  Service: Gastroenterology;  Laterality: N/A;  11:15   POLYPECTOMY  01/15/2021   Procedure: POLYPECTOMY;  Surgeon: Dolores Frame, MD;  Location: [facility];  Service: Gastroenterology;;   TUBAL LIGATION     Social History   Occupational History   Occupation: retired  Tobacco Use   Smoking status: Never   Smokeless tobacco: Never  Vaping Use   Vaping status: Never Used  Substance and Sexual Activity   Alcohol use: No    Alcohol/week: 0.0 standard drinks of alcohol   Drug use: No   Sexual activity: Not on file

## 2023-04-18 ENCOUNTER — Encounter: Payer: Self-pay | Admitting: Orthopedic Surgery

## 2023-04-18 MED ORDER — LIDOCAINE HCL 1 % IJ SOLN
5.0000 mL | INTRAMUSCULAR | Status: AC | PRN
Start: 2023-04-16 — End: 2023-04-16
  Administered 2023-04-16: 5 mL

## 2023-04-18 MED ORDER — BUPIVACAINE HCL 0.5 % IJ SOLN
9.0000 mL | INTRAMUSCULAR | Status: AC | PRN
  Administered 2023-04-16: 9 mL via INTRA_ARTICULAR

## 2023-04-18 MED ORDER — METHYLPREDNISOLONE ACETATE 40 MG/ML IJ SUSP
40.0000 mg | INTRAMUSCULAR | Status: AC | PRN
Start: 2023-04-16 — End: 2023-04-16
  Administered 2023-04-16: 40 mg via INTRA_ARTICULAR

## 2023-05-10 IMAGING — DX DG LUMBAR SPINE 2-3V
2 series · 2 of 2 positions shown · non-contrast
Comparison: None.

CLINICAL DATA: Back pain.

EXAM:
LUMBAR SPINE - 2-3 VIEW

[l-spine ap]
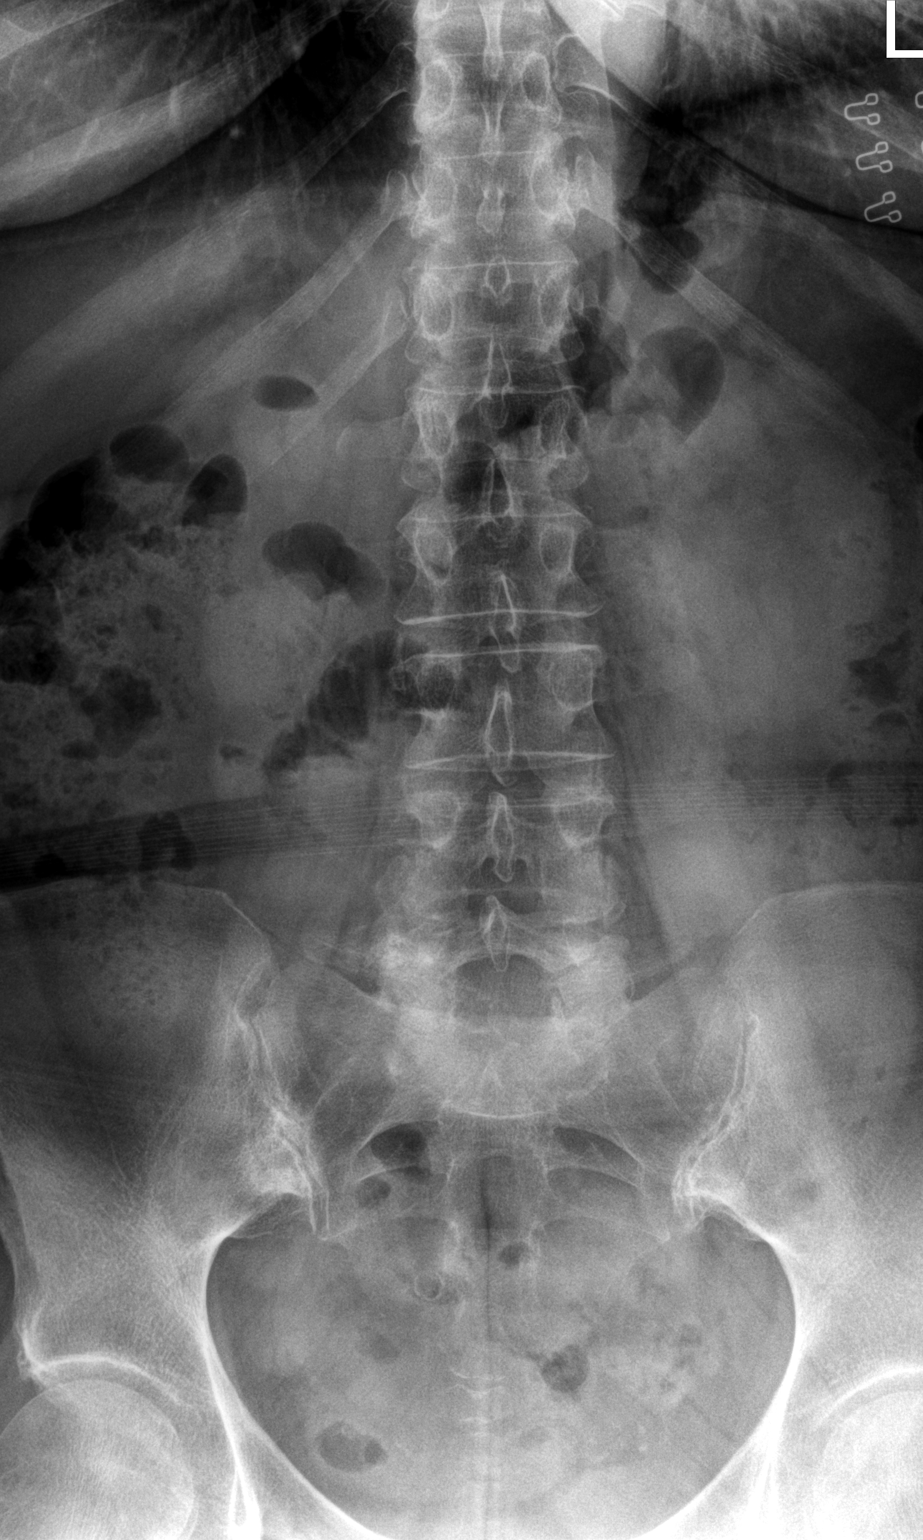

[l-spine lat]
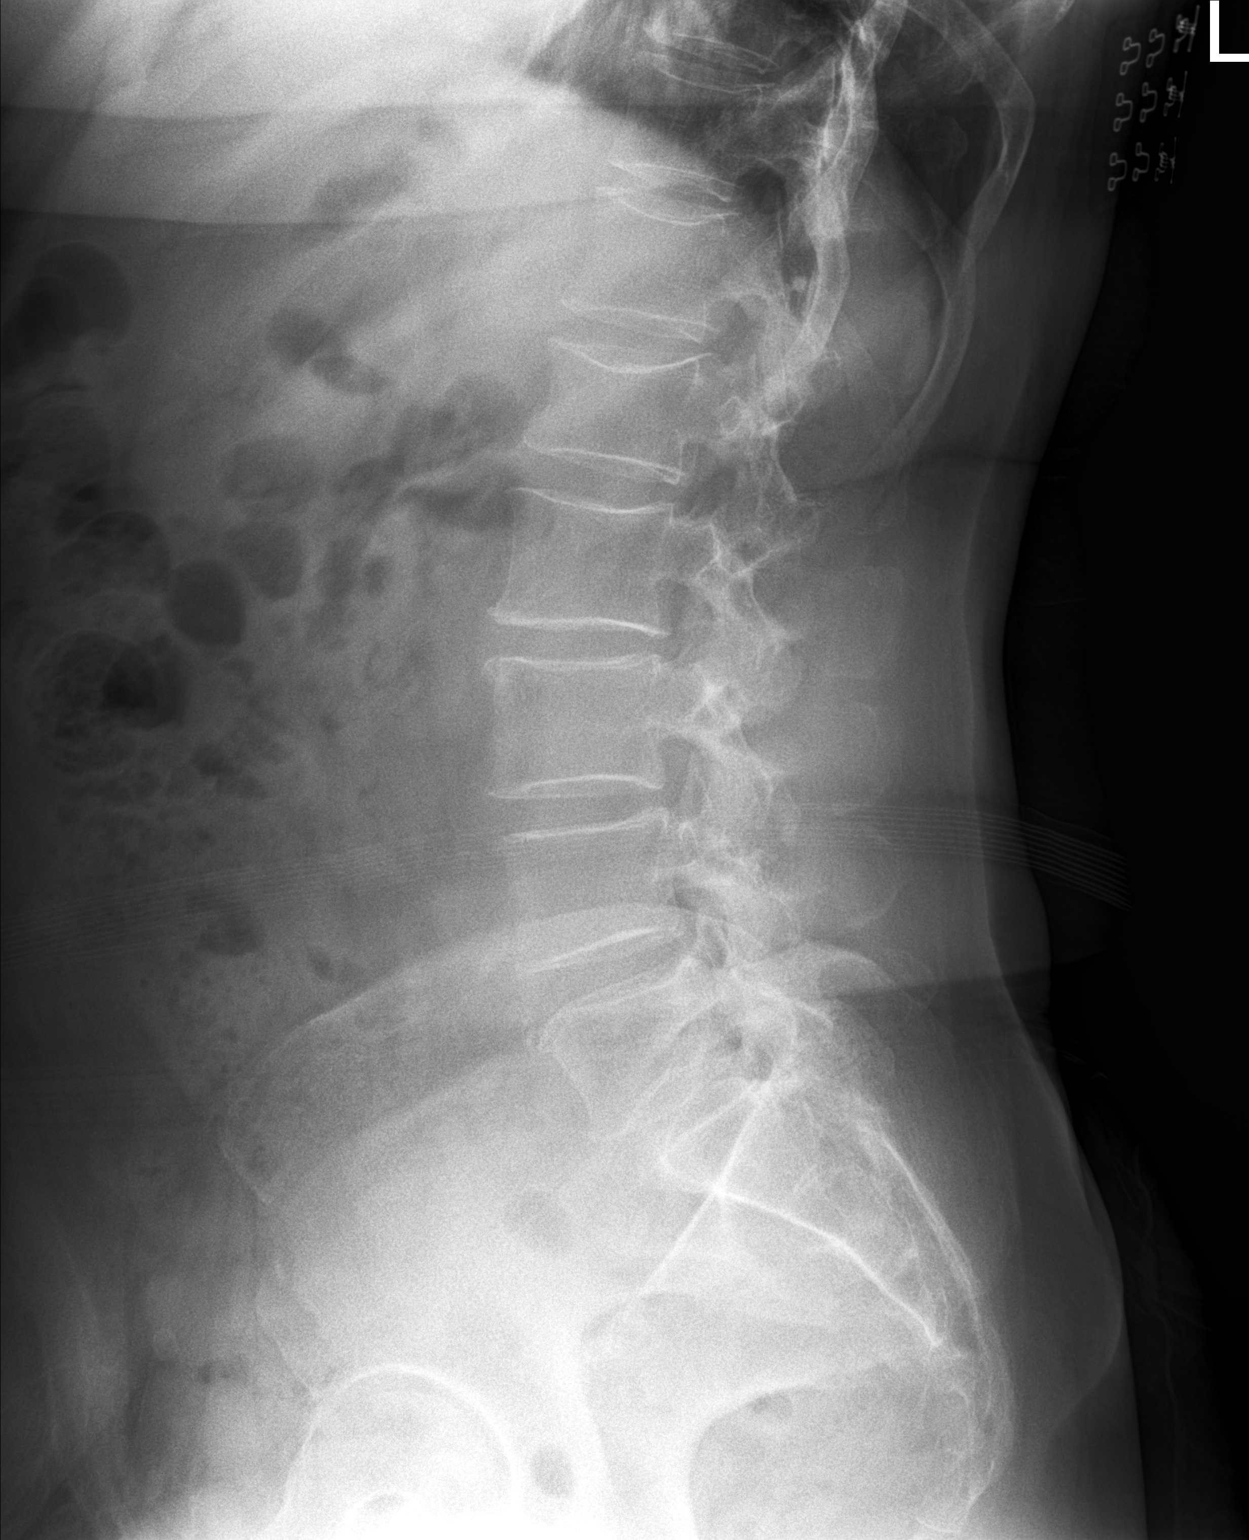

[2 of 2 positions shown; findings below may reference images not displayed]

FINDINGS: There are 5 non rib-bearing lumbar type vertebral bodies.

Normal alignment of the lumbar spine. No anterolisthesis or
retrolisthesis.

Age-indeterminate mild (approximately 25%) compression deformity
involving the superior endplate of the L1 vertebral body. Remaining
lumbar vertebral body heights appear preserved.

Mild multilevel lumbar spine DDD, worse at L2-L3 with disc space
height loss, endplate irregularity and sclerosis

Limited visualization of the bilateral SI joints is normal.

Large colonic stool burden without evidence of enteric obstruction.
Punctate phleboliths overlie the left hemipelvis.
IMPRESSION: 1. Age-indeterminate mild (approximately 25%) compression deformity
involving the superior endplate of the L1 vertebral body.
Correlation for point tenderness at this location is advised.
2. Mild multilevel lumbar spine DDD, worse at L2-L3.

## 2023-05-12 ENCOUNTER — Encounter (INDEPENDENT_AMBULATORY_CARE_PROVIDER_SITE_OTHER): Admitting: Ophthalmology

## 2023-05-13 ENCOUNTER — Encounter (INDEPENDENT_AMBULATORY_CARE_PROVIDER_SITE_OTHER): Admitting: Ophthalmology

## 2023-05-13 DIAGNOSIS — H43813 Vitreous degeneration, bilateral: Secondary | ICD-10-CM

## 2023-05-13 DIAGNOSIS — H353211 Exudative age-related macular degeneration, right eye, with active choroidal neovascularization: Secondary | ICD-10-CM | POA: Diagnosis not present

## 2023-05-13 DIAGNOSIS — H353121 Nonexudative age-related macular degeneration, left eye, early dry stage: Secondary | ICD-10-CM

## 2023-06-02 DIAGNOSIS — H353121 Nonexudative age-related macular degeneration, left eye, early dry stage: Secondary | ICD-10-CM | POA: Diagnosis not present

## 2023-06-02 DIAGNOSIS — H353211 Exudative age-related macular degeneration, right eye, with active choroidal neovascularization: Secondary | ICD-10-CM | POA: Diagnosis not present

## 2023-06-02 DIAGNOSIS — Z961 Presence of intraocular lens: Secondary | ICD-10-CM | POA: Diagnosis not present

## 2023-06-02 DIAGNOSIS — H26493 Other secondary cataract, bilateral: Secondary | ICD-10-CM | POA: Diagnosis not present

## 2023-06-09 ENCOUNTER — Encounter (INDEPENDENT_AMBULATORY_CARE_PROVIDER_SITE_OTHER): Admitting: Ophthalmology

## 2023-06-09 DIAGNOSIS — H353211 Exudative age-related macular degeneration, right eye, with active choroidal neovascularization: Secondary | ICD-10-CM | POA: Diagnosis not present

## 2023-06-09 DIAGNOSIS — H353121 Nonexudative age-related macular degeneration, left eye, early dry stage: Secondary | ICD-10-CM | POA: Diagnosis not present

## 2023-06-09 DIAGNOSIS — H43813 Vitreous degeneration, bilateral: Secondary | ICD-10-CM

## 2023-06-22 ENCOUNTER — Ambulatory Visit (INDEPENDENT_AMBULATORY_CARE_PROVIDER_SITE_OTHER): Admitting: Nurse Practitioner

## 2023-06-22 ENCOUNTER — Encounter: Payer: Self-pay | Admitting: Nurse Practitioner

## 2023-06-22 ENCOUNTER — Ambulatory Visit: Payer: Self-pay

## 2023-06-22 VITALS — BP 137/70 | HR 68 | Temp 97.1°F | Ht 67.0 in | Wt 181.6 lb

## 2023-06-22 DIAGNOSIS — W57XXXA Bitten or stung by nonvenomous insect and other nonvenomous arthropods, initial encounter: Secondary | ICD-10-CM | POA: Diagnosis not present

## 2023-06-22 DIAGNOSIS — S30860A Insect bite (nonvenomous) of lower back and pelvis, initial encounter: Secondary | ICD-10-CM | POA: Insufficient documentation

## 2023-06-22 MED ORDER — TRIAMCINOLONE ACETONIDE 0.1 % EX CREA
1.0000 | TOPICAL_CREAM | Freq: Two times a day (BID) | CUTANEOUS | 0 refills | Status: DC
Start: 2023-06-22 — End: 2023-10-12

## 2023-06-22 NOTE — Progress Notes (Signed)
 Acute Office Visit  Subjective:     Patient ID: Rachael Bryant, female    DOB: 1950-05-13, 73 y.o.   MRN: [id number]  Chief Complaint  Patient presents with   Rash    Rash on back that wraps around to breast for a week, burning, itching   Lance Kummer is a 73 year old female presenting on Jun 22, 2023, for an acute visit due to a painful, burning rash. She reports that the rash began approximately one week ago on her back and has since spread to her right flank and beneath her right breast. She describes the rash as burning and itching, with discomfort that interferes with her ability to wear a bra, stating, "I'm not able to wear a bra -- it burns and itches." She is unsure if it could be related to an insect bite but notes that she has been working outside in the yard recently. She denies fever or systemic symptoms.   Active Ambulatory Problems    Diagnosis Date Noted   Essential tremor 09/04/2014   Insomnia 09/04/2014   Anxiety state 09/04/2014   Depression 09/04/2014   Age-related osteoporosis without current pathological fracture 02/25/2018   Anxiety, generalized 01/02/2014   Dyslipidemia 05/01/2014   Acute bilateral low back pain without sciatica 07/03/2020   Change in bowel movement 01/10/2021   Elevated hemoglobin A1c 11/27/2014   Menopausal syndrome 01/11/2018   Upper respiratory infection, viral 01/30/2014   Vitamin D  deficiency 10/02/2021   Insect bite of lower back 06/22/2023   Resolved Ambulatory Problems    Diagnosis Date Noted   No Resolved Ambulatory Problems   Past Medical History:  Diagnosis Date   Anxiety    High cholesterol    Nodular basal cell carcinoma (BCC) 05/02/2020   PONV (postoperative nausea and vomiting)        Review of Systems  Constitutional:  Negative for chills and fever.  HENT:  Negative for sore throat.   Respiratory:  Negative for cough, shortness of breath and wheezing.   Cardiovascular:  Negative for chest pain and  leg swelling.  Gastrointestinal:  Negative for constipation, diarrhea, nausea and vomiting.  Skin:  Positive for itching and rash.       Back, flank and under breast   Neurological:  Negative for dizziness and headaches.   Negative unless indicated in HPI    Objective:    BP 137/70   Pulse 68   Temp (!) 97.1 F (36.2 C) (Temporal)   Ht 5\' 7"  (1.702 m)   Wt 181 lb 9.6 oz (82.4 kg)   SpO2 95%   BMI 28.44 kg/m  BP Readings from Last 3 Encounters:  06/22/23 137/70  03/23/23 (!) 145/79  01/14/23 123/68   Wt Readings from Last 3 Encounters:  06/22/23 181 lb 9.6 oz (82.4 kg)  03/23/23 181 lb (82.1 kg)  02/09/23 182 lb (82.6 kg)      Physical Exam Vitals and nursing note reviewed.  Constitutional:      General: She is not in acute distress. HENT:     Head: Normocephalic and atraumatic.     Nose: Nose normal.     Mouth/Throat:     Mouth: Mucous membranes are moist.     Pharynx: Oropharynx is clear.  Eyes:     General: No scleral icterus.    Extraocular Movements: Extraocular movements intact.     Conjunctiva/sclera: Conjunctivae normal.     Pupils: Pupils are equal, round, and reactive to light.  Skin:  Findings: Rash present.  Neurological:     Mental Status: She is alert and oriented to person, place, and time.  Psychiatric:        Mood and Affect: Mood normal.        Behavior: Behavior normal.        Thought Content: Thought content normal.        Judgment: Judgment normal.     No results found for any visits on 06/22/23.      Assessment & Plan:  Insect bite of lower back, initial encounter -     Triamcinolone Acetonide; Apply 1 Application topically 2 (two) times daily.  Dispense: 60 g; Refill: 0   [name] is a 73 year old Caucasian female seen today for possible insect bite, no acute acute distress Triamcinolone twice daily, wash and dry area - client to call the clinic if rash continue spreading   The above assessment and management plan was  discussed with the patient. The patient verbalized understanding of and has agreed to the management plan. Patient is aware to call the clinic if they develop any new symptoms or if symptoms persist or worsen. Patient is aware when to return to the clinic for a follow-up visit. Patient educated on when it is appropriate to go to the emergency department.  No follow-ups on file.  Elna Radovich [place] Thompson, DNP [facility] [address] [place], [place] [address] [phone]  Note: This document was prepared by [employer] voice dictation technology and any errors that results from this process are unintentional.

## 2023-06-22 NOTE — Telephone Encounter (Signed)
 Chief Complaint: Rash, right back/side Symptoms: Burning Frequency: x 6 days Pertinent Negatives: Patient denies fever, N/V, HA Disposition: [] ED /[] Urgent Care (no appt availability in office) / [x] Appointment(In office/virtual)/ []  [facility]/ [] Home Care/ [] Refused Recommended Disposition /[] [facility]/ []  Follow-up with PCP Additional Notes: Pt reports she began with rash to her right back that appeared to be "like bug bites" on Wednesday. Pt reports rash has since worsened and spread to right side/under right breast. Reports mild itching, 5/10 burning pain. Denies fever, HA, N/V. OV scheduled. This RN educated pt on home care, new-worsening symptoms, when to call back/seek emergent care. Pt verbalized understanding and agrees to plan.    Copied from CRM [id number]. Topic: Clinical - Red Word Triage >> Jun 22, 2023 10:26 AM Danelle Dunning F wrote: Red Word that prompted transfer to Nurse Triage:   Rash broke out on her back: looks like a bite but its a bit bigger; Wraps around from your back on the right side underneath her breast; Started Wednesday of last week; it burn to the touch Reason for Disposition  [1] Localized rash is very painful AND [2] no fever  Answer Assessment - Initial Assessment Questions 1. APPEARANCE of RASH: "Describe the rash."      Raised red, "looks like a bite" 2. LOCATION: "Where is the rash located?"      Right back/right side under breast 3. NUMBER: "How many spots are there?"      6 4. SIZE: "How big are the spots?" (Inches, centimeters or compare to size of a coin)      Thumb sized 5. ONSET: "When did the rash start?"      X 6 days 6. ITCHING: "Does the rash itch?" If Yes, ask: "How bad is the itch?"  (Scale 0-10; or none, mild, moderate, severe)     Mild 7. PAIN: "Does the rash hurt?" If Yes, ask: "How bad is the pain?"  (Scale 0-10; or none, mild, moderate, severe)    - NONE (0): no pain    - MILD (1-3): doesn't interfere with  normal activities     - MODERATE (4-7): interferes with normal activities or awakens from sleep     - SEVERE (8-10): excruciating pain, unable to do any normal activities     5/10 8. OTHER SYMPTOMS: "Do you have any other symptoms?" (e.g., fever)     None  Protocols used: Rash or Redness - Localized-A-AH

## 2023-06-22 NOTE — Telephone Encounter (Signed)
 Appt today

## 2023-06-24 ENCOUNTER — Ambulatory Visit: Payer: Self-pay

## 2023-06-24 NOTE — Telephone Encounter (Signed)
  Chief Complaint: Rash not resolved Symptoms: Rash Frequency: constant Pertinent Negatives: Patient denies fever, new symptoms, spreading rash, signs of infection.  Disposition: [] ED /[] Urgent Care (no appt availability in office) / [] Appointment(In office/virtual)/ []  [facility]/ [x] Home Care/ [] Refused Recommended Disposition /[] [facility]/ []  Follow-up with PCP Additional Notes:  Evaluated on 06/22/23. Rash has not improved, it is the same 6 spots on bra line, two on chest, 4 on back, however the two spots on her "front boobies" look like they are healing. Denies spreading rash or new symptoms. She is thinking about how she changed laundry products and wondering if this rash is a reaction. She does feel the need for evaluation, just wants to let Dr. Agapito Alcide. Verma Gobble, who evaluated her, know that rash is the same and that she believes it was from a new product. Educated on care advice as documented in protocol, patient verbalized understanding. Discussed reasons to call back or call for EMS.    Copied from CRM [id number]. Topic: Clinical - Red Word Triage >> Jun 24, 2023 10:36 AM Elle L wrote: Red Word that prompted transfer to Nurse Triage: The patient states she was advised by NP Villa Greaser to call back today if her symptoms have not gotten better. She states she has a rash that is burning and itching and the cream that was prescribed is not helping. Reason for Disposition  Mild localized rash  Protocols used: Rash or Redness - Localized-A-AH

## 2023-07-07 ENCOUNTER — Encounter (INDEPENDENT_AMBULATORY_CARE_PROVIDER_SITE_OTHER): Admitting: Ophthalmology

## 2023-07-07 DIAGNOSIS — H43813 Vitreous degeneration, bilateral: Secondary | ICD-10-CM

## 2023-07-07 DIAGNOSIS — H353211 Exudative age-related macular degeneration, right eye, with active choroidal neovascularization: Secondary | ICD-10-CM | POA: Diagnosis not present

## 2023-07-07 DIAGNOSIS — H353121 Nonexudative age-related macular degeneration, left eye, early dry stage: Secondary | ICD-10-CM

## 2023-07-15 ENCOUNTER — Encounter: Payer: Self-pay | Admitting: Family Medicine

## 2023-07-15 ENCOUNTER — Ambulatory Visit: Payer: Medicare Other | Admitting: Family Medicine

## 2023-07-15 ENCOUNTER — Ambulatory Visit (INDEPENDENT_AMBULATORY_CARE_PROVIDER_SITE_OTHER): Payer: Medicare Other

## 2023-07-15 ENCOUNTER — Telehealth: Payer: Self-pay

## 2023-07-15 ENCOUNTER — Other Ambulatory Visit (HOSPITAL_COMMUNITY): Payer: Self-pay

## 2023-07-15 ENCOUNTER — Other Ambulatory Visit: Payer: Self-pay

## 2023-07-15 VITALS — BP 134/79 | HR 65 | Temp 97.9°F | Ht 67.0 in | Wt 182.0 lb

## 2023-07-15 DIAGNOSIS — M19012 Primary osteoarthritis, left shoulder: Secondary | ICD-10-CM | POA: Diagnosis not present

## 2023-07-15 DIAGNOSIS — M810 Age-related osteoporosis without current pathological fracture: Secondary | ICD-10-CM

## 2023-07-15 DIAGNOSIS — G250 Essential tremor: Secondary | ICD-10-CM | POA: Diagnosis not present

## 2023-07-15 DIAGNOSIS — E785 Hyperlipidemia, unspecified: Secondary | ICD-10-CM

## 2023-07-15 DIAGNOSIS — Z780 Asymptomatic menopausal state: Secondary | ICD-10-CM | POA: Diagnosis not present

## 2023-07-15 MED ORDER — DENOSUMAB 60 MG/ML ~~LOC~~ SOSY: 60.0000 mg | PREFILLED_SYRINGE | Freq: Once | SUBCUTANEOUS | Status: DC

## 2023-07-15 MED ORDER — RISEDRONATE SODIUM 150 MG PO TABS
150.0000 mg | ORAL_TABLET | ORAL | 3 refills | Status: DC
Start: 2023-07-15 — End: 2023-07-15

## 2023-07-15 MED ORDER — DENOSUMAB 60 MG/ML ~~LOC~~ SOSY
60.0000 mg | PREFILLED_SYRINGE | Freq: Once | SUBCUTANEOUS | 0 refills | Status: AC
Start: 2023-07-15 — End: 2023-07-15

## 2023-07-15 MED ORDER — VITAMIN D (ERGOCALCIFEROL) 1.25 MG (50000 UNIT) PO CAPS: 50000.0000 [IU] | ORAL_CAPSULE | ORAL | 3 refills | Status: AC

## 2023-07-15 NOTE — Telephone Encounter (Signed)
 Rachael Bryant

## 2023-07-15 NOTE — Progress Notes (Signed)
 Subjective:  Patient ID: Rachael Bryant, female    DOB: 02-20-1950  Age: 73 y.o. MRN: [id number]  CC: Medical Management of Chronic Issues   HPI Rachael Bryant presents for  in for follow-up of elevated cholesterol. Doing well without complaints on current medication. Denies side effects of statin including myalgia and arthralgia and nausea. Currently no chest pain, shortness of breath or other cardiovascular related symptoms noted.  She has conntinued to use fosamax  weekly although risedronate  was ordered for her. The alendronate  makes her feel sick. Interested in change. Also interested in Prolia , but tried before, made several calls to the office. Neer heard back so she just gave up.   Seeing ortho again soon for the left shoulder. She has bone on bone arthritis. Wants to avoid surgery.   Tremor well controlled with propranolol . Occasionally wears off in the late afternoon.       03/23/2023   10:12 AM 02/09/2023    9:04 AM 04/16/2022    2:33 PM  Depression screen PHQ 2/9  Decreased Interest 0 0 0  Down, Depressed, Hopeless 0 0 0  PHQ - 2 Score 0 0 0  Altered sleeping 0    Tired, decreased energy 0    Change in appetite 0    Feeling bad or failure about yourself  0    Trouble concentrating 0    Moving slowly or fidgety/restless 0    Suicidal thoughts 0    PHQ-9 Score 0    Difficult doing work/chores Not difficult at all      History Rachael Bryant has a past medical history of Anxiety, High cholesterol, Nodular 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   She has a past surgical history that includes Tubal ligation; Colonoscopy with propofol  (N/A, 01/15/2021); and polypectomy (01/15/2021).   Her family history includes Diabetes in her mother; Heart attack in her father; Parkinsonism in her sister; Pulmonary fibrosis in her mother.She reports that she has never smoked. She has never used smokeless tobacco. She reports that she does not  drink alcohol and does not use drugs.    ROS Review of Systems  Constitutional: Negative.   HENT: Negative.    Eyes:  Negative for visual disturbance.  Respiratory:  Negative for shortness of breath.   Cardiovascular:  Negative for chest pain.  Gastrointestinal:  Negative for abdominal pain.  Musculoskeletal:  Negative for arthralgias.    Objective:  BP 134/79   Pulse 65   Temp 97.9 F (36.6 C)   Ht 5\' 7"  (1.702 m)   Wt 182 lb (82.6 kg)   SpO2 97%   BMI 28.51 kg/m   BP Readings from Last 3 Encounters:  07/15/23 134/79  06/22/23 137/70  03/23/23 (!) 145/79    Wt Readings from Last 3 Encounters:  07/15/23 182 lb (82.6 kg)  06/22/23 181 lb 9.6 oz (82.4 kg)  03/23/23 181 lb (82.1 kg)     Physical Exam Constitutional:      General: She is not in acute distress.    Appearance: She is well-developed.  Cardiovascular:     Rate and Rhythm: Normal rate and regular rhythm.  Pulmonary:     Breath sounds: Normal breath sounds.  Musculoskeletal:        General: Tenderness (left shoulder) present. Normal range of motion.  Skin:    General: Skin is warm and dry.  Neurological:     Mental Status: She is alert and oriented to person, place, and  time.      Assessment & Plan:  Essential tremor  Age-related osteoporosis without current pathological fracture -     Vitamin D  (Ergocalciferol ); Take 1 capsule (50,000 Units total) by mouth every 7 (seven) days.  Dispense: 13 capsule; Refill: 3 -     Denosumab ; Inject 60 mg into the skin once for 1 dose.  Dispense: 1 mL; Refill: 0  Dyslipidemia -     CMP14+EGFR -     Lipid panel  Arthritis of left shoulder   Considering surgery. Seeing ortho  Follow-up: Return in about 6 months (around 01/14/2024) for [facility] physical.  Rachael Bryant, M.D.

## 2023-07-15 NOTE — Telephone Encounter (Signed)
 Prolia VOB initiated via [email or web address]  Next Prolia inj DUE: NEW START

## 2023-07-15 NOTE — Telephone Encounter (Signed)
 Prior Authorization form/request asks a question that requires your assistance. Please see the question below and advise accordingly. The PA will not be submitted until the necessary information is received.        Insurance requires failure or contraindication to BOTH an oral and IV bisphosphate. Only see documentation of failure of oral bisphosphate.

## 2023-07-16 ENCOUNTER — Ambulatory Visit: Payer: Self-pay | Admitting: Family Medicine

## 2023-07-16 LAB — CMP14+EGFR
ALT: 13 IU/L (ref 0–32)
AST: 18 IU/L (ref 0–40)
Albumin: 4.2 g/dL (ref 3.9–4.9)
Alkaline Phosphatase: 123 IU/L — ABNORMAL HIGH (ref 44–121)
BUN/Creatinine Ratio: 15 (ref 12–28)
BUN: 12 mg/dL (ref 8–27)
Bilirubin Total: 0.4 mg/dL (ref 0.0–1.2)
CO2: 22 mmol/L (ref 20–29)
Calcium: 9.3 mg/dL (ref 8.7–10.3)
Chloride: 103 mmol/L (ref 96–106)
Creatinine, Ser: 0.8 mg/dL (ref 0.57–1.00)
Globulin, Total: 2.4 g/dL (ref 1.5–4.5)
Glucose: 83 mg/dL (ref 70–99)
Potassium: 4.4 mmol/L (ref 3.5–5.2)
Sodium: 140 mmol/L (ref 134–144)
Total Protein: 6.6 g/dL (ref 6.0–8.5)
eGFR: 79 mL/min/{1.73_m2} (ref >59)

## 2023-07-16 LAB — LIPID PANEL
Chol/HDL Ratio: 4.5 ratio — ABNORMAL HIGH (ref 0.0–4.4)
Cholesterol, Total: 187 mg/dL (ref 100–199)
HDL: 42 mg/dL (ref >39)
LDL Chol Calc (NIH): 123 mg/dL — ABNORMAL HIGH (ref 0–99)
Triglycerides: 119 mg/dL (ref 0–149)
VLDL Cholesterol Cal: 22 mg/dL (ref 5–40)

## 2023-07-16 NOTE — Telephone Encounter (Signed)
 Hi Dr Veleta Gerold,  I see where she has failed a PO bisphosphonate (alendronate ), but she hasn't tried/failed the IV bispohosphonate (Reclast).  This is usually cheaper than prolia  and administered once a year at [facility].  Her insurance won't allow prolia  unless she has tried Reclast first.  Let me know! Thanks, Shota Kohrs

## 2023-07-16 NOTE — Telephone Encounter (Signed)
 Pt. Needs assistance for prolia . Didn't meet criteria for prior auth

## 2023-07-16 NOTE — Telephone Encounter (Signed)
 Pt. Will have to do a once a year IV infusion to get her insurance to pay. If willing I can order. It has to be done at [facility] or possibly at [facility]. Takes about an Hour

## 2023-07-17 DIAGNOSIS — M810 Age-related osteoporosis without current pathological fracture: Secondary | ICD-10-CM | POA: Diagnosis not present

## 2023-07-17 DIAGNOSIS — Z780 Asymptomatic menopausal state: Secondary | ICD-10-CM | POA: Diagnosis not present

## 2023-07-17 NOTE — Telephone Encounter (Signed)
Left message for patient to contact the office.

## 2023-07-20 ENCOUNTER — Telehealth: Payer: Self-pay

## 2023-07-20 ENCOUNTER — Ambulatory Visit: Admitting: Orthopedic Surgery

## 2023-07-20 ENCOUNTER — Other Ambulatory Visit: Payer: Self-pay | Admitting: Family Medicine

## 2023-07-20 ENCOUNTER — Ambulatory Visit: Payer: Self-pay | Admitting: Family Medicine

## 2023-07-20 DIAGNOSIS — M810 Age-related osteoporosis without current pathological fracture: Secondary | ICD-10-CM

## 2023-07-20 MED ORDER — ALENDRONATE SODIUM 70 MG PO TABS: 70.0000 mg | ORAL_TABLET | ORAL | 3 refills | Status: AC

## 2023-07-20 NOTE — Telephone Encounter (Signed)
 Documented in a separate encounter. LS

## 2023-07-20 NOTE — Telephone Encounter (Signed)
 Copied from CRM [id number]. Topic: Clinical - Lab/Test Results >> Jul 20, 2023 12:02 PM Zipporah Him wrote: Reason for CRM: Calling back to reach [facility] in regard to DEXA results.

## 2023-07-21 NOTE — Telephone Encounter (Signed)
 Pt declined IV. LS

## 2023-07-22 ENCOUNTER — Ambulatory Visit: Admitting: Orthopedic Surgery

## 2023-07-22 ENCOUNTER — Encounter: Payer: Self-pay | Admitting: Orthopedic Surgery

## 2023-07-22 DIAGNOSIS — M19012 Primary osteoarthritis, left shoulder: Secondary | ICD-10-CM

## 2023-07-22 NOTE — Progress Notes (Signed)
 Office Visit Note   Patient: Rachael Bryant           Date of Birth: 17-Dec-1950           MRN: [id number] Visit Date: 07/22/2023 Requested by: Roise Cleaver, MD [address] [place],  [place] [address] PCP: Roise Cleaver, MD  Subjective: Chief Complaint  Patient presents with   Left Shoulder - Follow-up    HPI: Rachael Bryant is a 73 y.o. female who presents to the office reporting continued left shoulder pain.  Had glenohumeral joint injection 04/16/2023 which helped a little bit for several weeks.  Currently her shoulder is back about the same as it was.  Not currently taking pain medication.  She is left-hand dominant.  She wakes up when she lies on her left shoulder.  She does have a 71-year-old grandson and another grandchild coming in December.  She has known end-stage left shoulder arthritis.  Right shoulder is functional..                ROS: All systems reviewed are negative as they relate to the chief complaint within the history of present illness.  Patient denies fevers or chills.  Assessment & Plan: Visit Diagnoses:  1. Arthritis of left shoulder region     Plan: Impression is left shoulder arthritis with limitation of motion and pain.  I think she is a pretty good candidate for shoulder replacement.  She is going to consider her options and call if she wants to get scheduled.  She needs to let us  know about a month ahead of time.  She is taking about potentially in September.  Would want to get an CT scan for preoperative planning prior to surgery.  The risk and benefits of surgery discussed with the patient including but limited to infection or vessel damage instability as well as incomplete pain relief and incomplete functional restoration.  I do think that she would be able to fix her hair to get her hand behind her head with the reverse replacement.  Pain relief would also be predictable in her case.  All questions answered.  Follow-Up Instructions: No follow-ups on  file.   Orders:  No orders of the defined types were placed in this encounter.  No orders of the defined types were placed in this encounter.     Procedures: No procedures performed   Clinical Data: No additional findings.  Objective: Vital Signs: There were no vitals taken for this visit.  Physical Exam:  Constitutional: Patient appears well-developed HEENT:  Head: Normocephalic Eyes:EOM are normal Neck: Normal range of motion Cardiovascular: Normal rate Pulmonary/chest: Effort normal Neurologic: Patient is alert Skin: Skin is warm Psychiatric: Patient has normal mood and affect  Ortho Exam: Ortho exam demonstrates intact skin around the left shoulder region.  Range of motion today is 12/09/1988 with functional deltoid and pretty good rotator cuff strength to internal and external rotation.  Motor sensory function of the hand is intact  Specialty Comments:  No specialty comments available.  Imaging: No results found.   PMFS History: Patient Active Problem List   Diagnosis Date Noted   Insect bite of lower back 06/22/2023   Vitamin D  deficiency 10/02/2021   Change in bowel movement 01/10/2021   Acute bilateral low back pain without sciatica 07/03/2020   Age-related osteoporosis without current pathological fracture 02/25/2018   Menopausal syndrome 01/11/2018   Elevated hemoglobin A1c 11/27/2014   Essential tremor 09/04/2014   Insomnia 09/04/2014  Anxiety state 09/04/2014   Depression 09/04/2014   Dyslipidemia 05/01/2014   Upper respiratory infection, viral 01/30/2014   Anxiety, generalized 01/02/2014   Past Medical History:  Diagnosis Date   Anxiety    High cholesterol    Nodular basal cell carcinoma (BCC) 05/02/2020   Left Alar Crease   PONV (postoperative nausea and vomiting)     Family History  Problem Relation Age of Onset   Heart attack Father    Diabetes Mother    Pulmonary fibrosis Mother    Parkinsonism Sister     Past Surgical  History:  Procedure Laterality Date   COLONOSCOPY WITH PROPOFOL  N/A 01/15/2021   Procedure: COLONOSCOPY WITH PROPOFOL ;  Surgeon: Urban Garden, MD;  [facility]: [facility];  Service: Gastroenterology;  Laterality: N/A;  11:15   POLYPECTOMY  01/15/2021   Procedure: POLYPECTOMY;  Surgeon: Urban Garden, MD;  Location: [facility];  Service: Gastroenterology;;   TUBAL LIGATION     Social History   Occupational History   Occupation: retired  Tobacco Use   Smoking status: Never   Smokeless tobacco: Never  Vaping Use   Vaping status: Never Used  Substance and Sexual Activity   Alcohol use: No    Alcohol/week: 0.0 standard drinks of alcohol   Drug use: No   Sexual activity: Not on file

## 2023-08-04 ENCOUNTER — Encounter (INDEPENDENT_AMBULATORY_CARE_PROVIDER_SITE_OTHER): Admitting: Ophthalmology

## 2023-08-04 DIAGNOSIS — H43813 Vitreous degeneration, bilateral: Secondary | ICD-10-CM | POA: Diagnosis not present

## 2023-08-04 DIAGNOSIS — H353211 Exudative age-related macular degeneration, right eye, with active choroidal neovascularization: Secondary | ICD-10-CM | POA: Diagnosis not present

## 2023-08-04 DIAGNOSIS — H353121 Nonexudative age-related macular degeneration, left eye, early dry stage: Secondary | ICD-10-CM | POA: Diagnosis not present

## 2023-08-08 ENCOUNTER — Other Ambulatory Visit: Payer: Self-pay | Admitting: Family Medicine

## 2023-08-08 DIAGNOSIS — G250 Essential tremor: Secondary | ICD-10-CM

## 2023-09-01 ENCOUNTER — Encounter (INDEPENDENT_AMBULATORY_CARE_PROVIDER_SITE_OTHER): Admitting: Ophthalmology

## 2023-09-01 DIAGNOSIS — H353121 Nonexudative age-related macular degeneration, left eye, early dry stage: Secondary | ICD-10-CM

## 2023-09-01 DIAGNOSIS — H43813 Vitreous degeneration, bilateral: Secondary | ICD-10-CM | POA: Diagnosis not present

## 2023-09-01 DIAGNOSIS — H353211 Exudative age-related macular degeneration, right eye, with active choroidal neovascularization: Secondary | ICD-10-CM | POA: Diagnosis not present

## 2023-09-29 ENCOUNTER — Encounter (INDEPENDENT_AMBULATORY_CARE_PROVIDER_SITE_OTHER): Admitting: Ophthalmology

## 2023-09-29 DIAGNOSIS — H43813 Vitreous degeneration, bilateral: Secondary | ICD-10-CM

## 2023-09-29 DIAGNOSIS — H353211 Exudative age-related macular degeneration, right eye, with active choroidal neovascularization: Secondary | ICD-10-CM | POA: Diagnosis not present

## 2023-09-29 DIAGNOSIS — H26491 Other secondary cataract, right eye: Secondary | ICD-10-CM

## 2023-09-29 DIAGNOSIS — H353121 Nonexudative age-related macular degeneration, left eye, early dry stage: Secondary | ICD-10-CM

## 2023-10-07 ENCOUNTER — Other Ambulatory Visit: Payer: Self-pay | Admitting: Family Medicine

## 2023-10-07 ENCOUNTER — Other Ambulatory Visit: Payer: Self-pay | Admitting: Nurse Practitioner

## 2023-10-07 DIAGNOSIS — F339 Major depressive disorder, recurrent, unspecified: Secondary | ICD-10-CM

## 2023-10-07 DIAGNOSIS — F411 Generalized anxiety disorder: Secondary | ICD-10-CM

## 2023-10-07 DIAGNOSIS — W57XXXA Bitten or stung by nonvenomous insect and other nonvenomous arthropods, initial encounter: Secondary | ICD-10-CM

## 2023-10-08 NOTE — Telephone Encounter (Signed)
 Last OV 07/15/23. Last RF 06/22/23. Next OV none scheduled

## 2023-10-13 ENCOUNTER — Encounter (INDEPENDENT_AMBULATORY_CARE_PROVIDER_SITE_OTHER): Admitting: Ophthalmology

## 2023-10-13 DIAGNOSIS — H26491 Other secondary cataract, right eye: Secondary | ICD-10-CM | POA: Diagnosis not present

## 2023-10-27 ENCOUNTER — Encounter (INDEPENDENT_AMBULATORY_CARE_PROVIDER_SITE_OTHER): Admitting: Ophthalmology

## 2023-10-27 DIAGNOSIS — H353211 Exudative age-related macular degeneration, right eye, with active choroidal neovascularization: Secondary | ICD-10-CM | POA: Diagnosis not present

## 2023-10-27 DIAGNOSIS — H43813 Vitreous degeneration, bilateral: Secondary | ICD-10-CM

## 2023-10-27 DIAGNOSIS — H353121 Nonexudative age-related macular degeneration, left eye, early dry stage: Secondary | ICD-10-CM

## 2023-11-18 DIAGNOSIS — L820 Inflamed seborrheic keratosis: Secondary | ICD-10-CM | POA: Diagnosis not present

## 2023-11-24 ENCOUNTER — Encounter (INDEPENDENT_AMBULATORY_CARE_PROVIDER_SITE_OTHER): Admitting: Ophthalmology

## 2023-11-24 DIAGNOSIS — H43813 Vitreous degeneration, bilateral: Secondary | ICD-10-CM | POA: Diagnosis not present

## 2023-11-24 DIAGNOSIS — H353211 Exudative age-related macular degeneration, right eye, with active choroidal neovascularization: Secondary | ICD-10-CM | POA: Diagnosis not present

## 2023-11-24 DIAGNOSIS — H353121 Nonexudative age-related macular degeneration, left eye, early dry stage: Secondary | ICD-10-CM

## 2023-12-22 ENCOUNTER — Encounter (INDEPENDENT_AMBULATORY_CARE_PROVIDER_SITE_OTHER): Admitting: Ophthalmology

## 2023-12-22 DIAGNOSIS — H43813 Vitreous degeneration, bilateral: Secondary | ICD-10-CM

## 2023-12-22 DIAGNOSIS — H353211 Exudative age-related macular degeneration, right eye, with active choroidal neovascularization: Secondary | ICD-10-CM

## 2023-12-22 DIAGNOSIS — H353121 Nonexudative age-related macular degeneration, left eye, early dry stage: Secondary | ICD-10-CM

## 2023-12-28 ENCOUNTER — Encounter: Payer: Self-pay | Admitting: Orthopedic Surgery

## 2023-12-28 ENCOUNTER — Ambulatory Visit: Admitting: Orthopedic Surgery

## 2023-12-28 DIAGNOSIS — M19012 Primary osteoarthritis, left shoulder: Secondary | ICD-10-CM | POA: Diagnosis not present

## 2023-12-28 NOTE — Addendum Note (Signed)
 Addended by: TRINDA DEANE HERO on: 12/28/2023 04:25 PM   Modules accepted: Orders

## 2023-12-28 NOTE — Progress Notes (Signed)
 Office Visit Note   Patient: Rachael Bryant           Date of Birth: 1951-01-01           MRN: [id number] Visit Date: 12/28/2023 Requested by: [name], MD [address] [place],  [place] [address] PCP: [name], MD  Subjective: No chief complaint on file.   HPI: Rachael Bryant is a 73 y.o. female who presents to the office reporting continued left shoulder pain.  Had an injection in 04/16/2023 which gave her minimal relief.  She is left-hand dominant.  Not really taking any medications for pain.  Uses [employer].  Her sister has had a reverse shoulder replacement and she has done well with that..                ROS: All systems reviewed are negative as they relate to the chief complaint within the history of present illness.  Patient denies fevers or chills.  Assessment & Plan: Visit Diagnoses: No diagnosis found.  Plan: Impression is end-stage arthritis left shoulder with significant impaction on activities of daily living.  Patient cannot really get her hand behind her head.  Severe arthritis is present.  Plan is left reverse shoulder replacement.  Risk and benefits are discussed with the patient include not limited to infection nerve vessel damage incomplete pain relief as well as incomplete restoration of function.  Instability also a risk.  The expected rehab time is discussed.  Patient is otherwise healthy.  Has no cardiac issues.  Patient understands risk and benefits and wishes to proceed.  We will likely aim for sometime in January.  All questions answered  Follow-Up Instructions: No follow-ups on file.   Orders:  No orders of the defined types were placed in this encounter.  No orders of the defined types were placed in this encounter.     Procedures: No procedures performed   Clinical Data: No additional findings.  Objective: Vital Signs: There were no vitals taken for this visit.  Physical Exam:  Constitutional: Patient appears well-developed HEENT:   Head: Normocephalic Eyes:EOM are normal Neck: Normal range of motion Cardiovascular: Normal rate Pulmonary/chest: Effort normal Neurologic: Patient is alert Skin: Skin is warm Psychiatric: Patient has normal mood and affect  Ortho Exam: Ortho exam demonstrates range of motion on the left of 15/60/90.  External rotation strength 5 out of 5 on the left subscap strength 5+ out of 5 on the left.  Deltoid dysfunction.  Radial pulse intact.  Motor or sensory function intact to the hand.  Specialty Comments:  No specialty comments available.  Imaging: No results found.   PMFS History: Patient Active Problem List   Diagnosis Date Noted   Insect bite of lower back 06/22/2023   Vitamin D  deficiency 10/02/2021   Change in bowel movement 01/10/2021   Acute bilateral low back pain without sciatica 07/03/2020   Age-related osteoporosis without current pathological fracture 02/25/2018   Menopausal syndrome 01/11/2018   Elevated hemoglobin A1c 11/27/2014   Essential tremor 09/04/2014   Insomnia 09/04/2014   Anxiety state 09/04/2014   Depression 09/04/2014   Dyslipidemia 05/01/2014   Upper respiratory infection, viral 01/30/2014   Anxiety, generalized 01/02/2014   Past Medical History:  Diagnosis Date   Anxiety    High cholesterol    Nodular basal cell carcinoma (BCC) 05/02/2020   Left Alar Crease   PONV (postoperative nausea and vomiting)     Family History  Problem Relation Age of Onset  Heart attack Father    Diabetes Mother    Pulmonary fibrosis Mother    Parkinsonism Sister     Past Surgical History:  Procedure Laterality Date   COLONOSCOPY WITH PROPOFOL  N/A 01/15/2021   Procedure: COLONOSCOPY WITH PROPOFOL ;  Surgeon: Eartha Angelia Sieving, MD;  [facility];  Service: Gastroenterology;  Laterality: N/A;  11:15   POLYPECTOMY  01/15/2021   Procedure: POLYPECTOMY;  Surgeon: Eartha Angelia Sieving, MD;  [facility]: [facility];  Service: Gastroenterology;;    TUBAL LIGATION     Social History   Occupational History   Occupation: retired  Tobacco Use   Smoking status: Never   Smokeless tobacco: Never  Vaping Use   Vaping status: Never Used  Substance and Sexual Activity   Alcohol use: No    Alcohol/week: 0.0 standard drinks of alcohol   Drug use: No   Sexual activity: Not on file

## 2024-01-06 ENCOUNTER — Ambulatory Visit
Admission: RE | Admit: 2024-01-06 | Discharge: 2024-01-06 | Disposition: A | Discharge status: Home / Self Care | Source: Ambulatory Visit | Attending: Orthopedic Surgery | Admitting: Orthopedic Surgery

## 2024-01-06 DIAGNOSIS — M19012 Primary osteoarthritis, left shoulder: Secondary | ICD-10-CM

## 2024-01-18 ENCOUNTER — Telehealth: Payer: Self-pay | Admitting: Surgical

## 2024-01-18 NOTE — Telephone Encounter (Signed)
 Pt called asking if Herlene or Addie can go over CT Scan over the phone. Pt number is [phone].

## 2024-01-19 ENCOUNTER — Encounter (INDEPENDENT_AMBULATORY_CARE_PROVIDER_SITE_OTHER): Admitting: Ophthalmology

## 2024-01-22 ENCOUNTER — Encounter (INDEPENDENT_AMBULATORY_CARE_PROVIDER_SITE_OTHER): Admitting: Ophthalmology

## 2024-01-22 DIAGNOSIS — H353211 Exudative age-related macular degeneration, right eye, with active choroidal neovascularization: Secondary | ICD-10-CM | POA: Diagnosis not present

## 2024-01-22 DIAGNOSIS — H353121 Nonexudative age-related macular degeneration, left eye, early dry stage: Secondary | ICD-10-CM

## 2024-01-22 DIAGNOSIS — H43813 Vitreous degeneration, bilateral: Secondary | ICD-10-CM | POA: Diagnosis not present

## 2024-01-22 NOTE — Telephone Encounter (Signed)
 noted

## 2024-01-22 NOTE — Telephone Encounter (Signed)
 I called and left voicemail.  It really only shows arthritis of the shoulder joint and the only reason we got it was for preoperative planning to have 3D printed model of the glenoid made.  No unexpected pathology.

## 2024-02-12 NOTE — Pre-Procedure Instructions (Signed)
 Surgical Instructions   Your procedure is scheduled on Thursday, January 8th. Report to [facility] Main Entrance A at 08:30 A.M., then check in with the Admitting office. Any questions or running late day of surgery: call [phone]  Questions prior to your surgery date: call [phone], Monday-Friday, 8am-4pm. If you experience any cold or flu symptoms such as cough, fever, chills, shortness of breath, etc. between now and your scheduled surgery, please notify us  at the above number.     Remember:  Do not eat after midnight the night before your surgery  You may drink clear liquids until 07:30 AM the morning of your surgery.   Clear liquids allowed are: Water, Non-Citrus Juices (without pulp), Carbonated Beverages, Clear Tea (no milk, honey, etc.), Black Coffee Only (NO MILK, CREAM OR POWDERED CREAMER of any kind), and Gatorade.    Take these medicines the morning of surgery with A SIP OF WATER  citalopram  (CELEXA )  propranolol  ER (INDERAL  LA)   May take these medicines IF NEEDED: albuterol  (VENTOLIN  HFA)- bring inhaler with you trimethoprim -polymyxin b  (POLYTRIM ) eye drops  One week prior to surgery, STOP taking any Aspirin (unless otherwise instructed by your surgeon) Aleve, Naproxen, Ibuprofen, Motrin, Advil, Goody's, BC's, all herbal medications, fish oil, and non-prescription vitamins.                     Do NOT Smoke (Tobacco/Vaping) for 24 hours prior to your procedure.  If you use a CPAP at night, you may bring your mask/headgear for your overnight stay.   You will be asked to remove any contacts, glasses, piercing's, hearing aid's, dentures/partials prior to surgery. Please bring cases for these items if needed.    Patients discharged the day of surgery will not be allowed to drive home, and someone needs to stay with them for 24 hours.  SURGICAL WAITING ROOM VISITATION Patients may have no more than 2 support people in the waiting area - these visitors may  rotate.   Pre-op nurse will coordinate an appropriate time for 1 ADULT support person, who may not rotate, to accompany patient in pre-op.  Children under the age of 35 must have an adult with them who is not the patient and must remain in the main waiting area with an adult.  If the patient needs to stay at the hospital during part of their recovery, the visitor guidelines for inpatient rooms apply.  Please refer to the [facility] website for the visitor guidelines for any additional information.   If you received a COVID test during your pre-op visit  it is requested that you wear a mask when out in public, stay away from anyone that may not be feeling well and notify your surgeon if you develop symptoms. If you have been in contact with anyone that has tested positive in the last 10 days please notify you surgeon.      Pre-operative 4 CHG Bathing Instructions   You can play a key role in reducing the risk of infection after surgery. Your skin needs to be as free of germs as possible. You can reduce the number of germs on your skin by washing with CHG (chlorhexidine gluconate) soap before surgery. CHG is an antiseptic soap that kills germs and continues to kill germs even after washing.   DO NOT use if you have an allergy to chlorhexidine/CHG or antibacterial soaps. If your skin becomes reddened or irritated, stop using the CHG and notify one of our RNs at  [phone].   Please shower with the CHG soap starting 4 days before surgery using the following schedule:     Please keep in mind the following:  DO NOT shave, including legs and underarms, starting the day of your first shower.   You may shave your face at any point before/day of surgery.  Place clean sheets on your bed the day you start using CHG soap. Use a clean washcloth (not used since being washed) for each shower. DO NOT sleep with pets once you start using the CHG.   CHG Shower Instructions:  Wash your face and  private area with normal soap. If you choose to wash your hair, wash first with your normal shampoo.  After you use shampoo/soap, rinse your hair and body thoroughly to remove shampoo/soap residue.  Turn the water OFF and apply  bottle of CHG soap to a CLEAN washcloth.  Apply CHG soap ONLY FROM YOUR NECK DOWN TO YOUR TOES (washing for 3-5 minutes)  DO NOT use CHG soap on face, private areas, open wounds, or sores.  Pay special attention to the area where your surgery is being performed.  If you are having back surgery, having someone wash your back for you may be helpful. Wait 2 minutes after CHG soap is applied, then you may rinse off the CHG soap.  Pat dry with a clean towel  Put on clean clothes/pajamas   If you choose to wear lotion, please use ONLY the CHG-compatible lotions that are listed below.  Additional instructions for the day of surgery:  If you choose, you may shower the morning of surgery with an antibacterial soap.  DO NOT APPLY any lotions, deodorants, cologne, or perfumes.   Do not bring valuables to the hospital. [facility] is not responsible for any belongings/valuables. Do not wear nail polish, gel polish, artificial nails, or any other type of covering on natural nails (fingers and toes) Do not wear jewelry or makeup Put on clean/comfortable clothes.  Please brush your teeth.  Ask your nurse before applying any prescription medications to the skin.     CHG Compatible Lotions   Aveeno Moisturizing lotion  Cetaphil Moisturizing Cream  Cetaphil Moisturizing Lotion  Clairol Herbal Essence Moisturizing Lotion, Dry Skin  Clairol Herbal Essence Moisturizing Lotion, Extra Dry Skin  Clairol Herbal Essence Moisturizing Lotion, Normal Skin  Curel Age Defying Therapeutic Moisturizing Lotion with Alpha Hydroxy  Curel Extreme Care Body Lotion  Curel Soothing Hands Moisturizing Hand Lotion  Curel Therapeutic Moisturizing Cream, Fragrance-Free  Curel Therapeutic  Moisturizing Lotion, Fragrance-Free  Curel Therapeutic Moisturizing Lotion, Original Formula  Eucerin Daily Replenishing Lotion  Eucerin Dry Skin Therapy Plus Alpha Hydroxy Crme  Eucerin Dry Skin Therapy Plus Alpha Hydroxy Lotion  Eucerin Original Crme  Eucerin Original Lotion  Eucerin Plus Crme Eucerin Plus Lotion  Eucerin TriLipid Replenishing Lotion  Keri Anti-Bacterial Hand Lotion  Keri Deep Conditioning Original Lotion Dry Skin Formula Softly Scented  Keri Deep Conditioning Original Lotion, Fragrance Free Sensitive Skin Formula  Keri Lotion Fast Absorbing Fragrance Free Sensitive Skin Formula  Keri Lotion Fast Absorbing Softly Scented Dry Skin Formula  Keri Original Lotion  Keri Skin Renewal Lotion Keri Silky Smooth Lotion  Keri Silky Smooth Sensitive Skin Lotion  Nivea Body Creamy Conditioning Oil  Nivea Body Extra Enriched Teacher, Adult Education Moisturizing Lotion Nivea Crme  Nivea Skin Firming Lotion  NutraDerm 30 Skin Lotion  NutraDerm Skin Lotion  NutraDerm Therapeutic Skin Cream  NutraDerm Therapeutic Skin Lotion  ProShield Protective Hand Cream  Provon moisturizing lotion   [place]- Preparing for Total Shoulder Arthroplasty  Before surgery, you can play an important role. Because skin is not sterile, your skin needs to be as free of germs as possible. You can reduce the number of germs on your skin by using the following products.   Benzoyl Peroxide Gel  o Reduces the number of germs present on the skin  o Applied twice a day to shoulder area starting two days before surgery   Chlorhexidine Gluconate (CHG) Soap (instructions listed above on how to wash with CHG Soap)  o An antiseptic cleaner that kills germs and bonds with the skin to continue killing germs even after washing  o Used for showering the night before surgery and morning of  surgery   ==================================================================  Please follow these instructions carefully:  BENZOYL PEROXIDE 5% GEL  Please do not use if you have an allergy to benzoyl peroxide. If your skin becomes reddened/irritated stop using the benzoyl peroxide.  Starting two days before surgery, apply as follows:  1. Apply benzoyl peroxide in the morning and at night. Apply after taking a shower. If you are not taking a shower clean entire shoulder front, back, and side along with the armpit with a clean wet washcloth.  2. Place a quarter-sized dollop on your SHOULDER and rub in thoroughly, making sure to cover the front, back, and side of your shoulder, along with the armpit.   2 Days prior to Surgery First Dose on _____________ Morning Second Dose on ______________ Night  Day Before Surgery First Dose on ______________ Morning Night before surgery wash (entire body except face and private areas) with CHG Soap THEN Second Dose on ____________ Night      4. Do NOT apply benzoyl peroxide gel on the day of surgery    Please read over the following fact sheets that you were given.

## 2024-02-12 NOTE — Pre-Procedure Instructions (Signed)
 Surgical Instructions   Your procedure is scheduled on Thursday, January 8th. Report to [facility] Main Entrance A at 08:30 A.M., then check in with the Admitting office. Any questions or running late day of surgery: call [phone]  Questions prior to your surgery date: call [phone], Monday-Friday, 8am-4pm. If you experience any cold or flu symptoms such as cough, fever, chills, shortness of breath, etc. between now and your scheduled surgery, please notify us  at the above number.     Remember:  Do not eat after midnight the night before your surgery  You may drink clear liquids until 07:30 AM the morning of your surgery.   Clear liquids allowed are: Water, Non-Citrus Juices (without pulp), Carbonated Beverages, Clear Tea (no milk, honey, etc.), Black Coffee Only (NO MILK, CREAM OR POWDERED CREAMER of any kind), and Gatorade.    Take these medicines the morning of surgery with A SIP OF WATER  citalopram  (CELEXA )  propranolol  ER (INDERAL  LA)   May take these medicines IF NEEDED: albuterol  (VENTOLIN  HFA)- bring inhaler with you trimethoprim -polymyxin b  (POLYTRIM ) eye drops  One week prior to surgery, STOP taking any Aspirin (unless otherwise instructed by your surgeon) Aleve, Naproxen, Ibuprofen, Motrin, Advil, Goody's, BC's, all herbal medications, fish oil, and non-prescription vitamins.                     Do NOT Smoke (Tobacco/Vaping) for 24 hours prior to your procedure.  If you use a CPAP at night, you may bring your mask/headgear for your overnight stay.   You will be asked to remove any contacts, glasses, piercing's, hearing aid's, dentures/partials prior to surgery. Please bring cases for these items if needed.    Patients discharged the day of surgery will not be allowed to drive home, and someone needs to stay with them for 24 hours.  SURGICAL WAITING ROOM VISITATION Patients may have no more than 2 support people in the waiting area - these visitors may  rotate.   Pre-op nurse will coordinate an appropriate time for 1 ADULT support person, who may not rotate, to accompany patient in pre-op.  Children under the age of 55 must have an adult with them who is not the patient and must remain in the main waiting area with an adult.  If the patient needs to stay at the hospital during part of their recovery, the visitor guidelines for inpatient rooms apply.  Please refer to the [facility] website for the visitor guidelines for any additional information.   If you received a COVID test during your pre-op visit  it is requested that you wear a mask when out in public, stay away from anyone that may not be feeling well and notify your surgeon if you develop symptoms. If you have been in contact with anyone that has tested positive in the last 10 days please notify you surgeon.      Pre-operative 4 CHG Bathing Instructions   You can play a key role in reducing the risk of infection after surgery. Your skin needs to be as free of germs as possible. You can reduce the number of germs on your skin by washing with CHG (chlorhexidine gluconate) soap before surgery. CHG is an antiseptic soap that kills germs and continues to kill germs even after washing.   DO NOT use if you have an allergy to chlorhexidine/CHG or antibacterial soaps. If your skin becomes reddened or irritated, stop using the CHG and notify one of our RNs at  [phone].   Please shower with the CHG soap starting 4 days before surgery using the following schedule:     Please keep in mind the following:  DO NOT shave, including legs and underarms, starting the day of your first shower.   You may shave your face at any point before/day of surgery.  Place clean sheets on your bed the day you start using CHG soap. Use a clean washcloth (not used since being washed) for each shower. DO NOT sleep with pets once you start using the CHG.   CHG Shower Instructions:  Wash your face and  private area with normal soap. If you choose to wash your hair, wash first with your normal shampoo.  After you use shampoo/soap, rinse your hair and body thoroughly to remove shampoo/soap residue.  Turn the water OFF and apply  bottle of CHG soap to a CLEAN washcloth.  Apply CHG soap ONLY FROM YOUR NECK DOWN TO YOUR TOES (washing for 3-5 minutes)  DO NOT use CHG soap on face, private areas, open wounds, or sores.  Pay special attention to the area where your surgery is being performed.  If you are having back surgery, having someone wash your back for you may be helpful. Wait 2 minutes after CHG soap is applied, then you may rinse off the CHG soap.  Pat dry with a clean towel  Put on clean clothes/pajamas   If you choose to wear lotion, please use ONLY the CHG-compatible lotions that are listed below.  Additional instructions for the day of surgery:  If you choose, you may shower the morning of surgery with an antibacterial soap.  DO NOT APPLY any lotions, deodorants, cologne, or perfumes.   Do not bring valuables to the hospital. [facility] is not responsible for any belongings/valuables. Do not wear nail polish, gel polish, artificial nails, or any other type of covering on natural nails (fingers and toes) Do not wear jewelry or makeup Put on clean/comfortable clothes.  Please brush your teeth.  Ask your nurse before applying any prescription medications to the skin.     CHG Compatible Lotions   Aveeno Moisturizing lotion  Cetaphil Moisturizing Cream  Cetaphil Moisturizing Lotion  Clairol Herbal Essence Moisturizing Lotion, Dry Skin  Clairol Herbal Essence Moisturizing Lotion, Extra Dry Skin  Clairol Herbal Essence Moisturizing Lotion, Normal Skin  Curel Age Defying Therapeutic Moisturizing Lotion with Alpha Hydroxy  Curel Extreme Care Body Lotion  Curel Soothing Hands Moisturizing Hand Lotion  Curel Therapeutic Moisturizing Cream, Fragrance-Free  Curel Therapeutic  Moisturizing Lotion, Fragrance-Free  Curel Therapeutic Moisturizing Lotion, Original Formula  Eucerin Daily Replenishing Lotion  Eucerin Dry Skin Therapy Plus Alpha Hydroxy Crme  Eucerin Dry Skin Therapy Plus Alpha Hydroxy Lotion  Eucerin Original Crme  Eucerin Original Lotion  Eucerin Plus Crme Eucerin Plus Lotion  Eucerin TriLipid Replenishing Lotion  Keri Anti-Bacterial Hand Lotion  Keri Deep Conditioning Original Lotion Dry Skin Formula Softly Scented  Keri Deep Conditioning Original Lotion, Fragrance Free Sensitive Skin Formula  Keri Lotion Fast Absorbing Fragrance Free Sensitive Skin Formula  Keri Lotion Fast Absorbing Softly Scented Dry Skin Formula  Keri Original Lotion  Keri Skin Renewal Lotion Keri Silky Smooth Lotion  Keri Silky Smooth Sensitive Skin Lotion  Nivea Body Creamy Conditioning Oil  Nivea Body Extra Enriched Teacher, Adult Education Moisturizing Lotion Nivea Crme  Nivea Skin Firming Lotion  NutraDerm 30 Skin Lotion  NutraDerm Skin Lotion  NutraDerm Therapeutic Skin Cream  NutraDerm Therapeutic Skin Lotion  ProShield Protective Hand Cream  Provon moisturizing lotion  Please read over the following fact sheets that you were given.

## 2024-02-15 ENCOUNTER — Other Ambulatory Visit: Payer: Self-pay

## 2024-02-15 ENCOUNTER — Encounter (HOSPITAL_COMMUNITY)
Admission: RE | Admit: 2024-02-15 | Discharge: 2024-02-15 | Disposition: A | Discharge status: Home / Self Care | Source: Ambulatory Visit | Attending: Orthopedic Surgery | Admitting: Orthopedic Surgery

## 2024-02-15 ENCOUNTER — Encounter (HOSPITAL_COMMUNITY): Payer: Self-pay

## 2024-02-15 VITALS — BP 152/88 | HR 66 | Temp 98.5°F | Resp 17 | Ht 67.0 in | Wt 184.0 lb

## 2024-02-15 DIAGNOSIS — I10 Essential (primary) hypertension: Secondary | ICD-10-CM | POA: Diagnosis not present

## 2024-02-15 DIAGNOSIS — Z01812 Encounter for preprocedural laboratory examination: Secondary | ICD-10-CM | POA: Diagnosis present

## 2024-02-15 DIAGNOSIS — Z01818 Encounter for other preprocedural examination: Secondary | ICD-10-CM

## 2024-02-15 HISTORY — DX: Essential tremor: G25.0

## 2024-02-15 LAB — CBC
HCT: 43.6 % (ref 36.0–46.0)
Hemoglobin: 14.6 g/dL (ref 12.0–15.0)
MCH: 30.5 pg (ref 26.0–34.0)
MCHC: 33.5 g/dL (ref 30.0–36.0)
MCV: 91 fL (ref 80.0–100.0)
Platelets: 289 K/uL (ref 150–400)
RBC: 4.79 MIL/uL (ref 3.87–5.11)
RDW: 12.9 % (ref 11.5–15.5)
WBC: 6.9 K/uL (ref 4.0–10.5)
nRBC: 0 % (ref 0.0–0.2)

## 2024-02-15 LAB — SURGICAL PCR SCREEN
MRSA, PCR: NEGATIVE
Staphylococcus aureus: NEGATIVE

## 2024-02-15 NOTE — Progress Notes (Signed)
 PCP - [name], MD  Cardiologist - denies  PPM/ICD - denies   Chest x-ray - N/A EKG - N/A Stress Test -denies  ECHO - denies Cardiac Cath - denies  Sleep Study - denies   Fasting Blood Sugar - N/A  Last dose of GLP1 agonist-  N/A   Blood Thinner Instructions: N/A Aspirin Instructions:N/A  ERAS Protcol - ERAS per protocol- no orders  COVID TEST- N/A   Anesthesia review:  no  Patient denies shortness of breath, fever, cough and chest pain at PAT appointment   All instructions explained to the patient, with a verbal understanding of the material. Patient agrees to go over the instructions while at home for a better understanding. The opportunity to ask questions was provided.

## 2024-02-16 NOTE — Care Plan (Signed)
 Ortho Bundle Case Management Note  Patient Details  Name: Rachael Bryant MRN: [id number] Date of Birth: 03-04-1950   [facility] call to patient to discuss her upcoming Left reverse shoulder arthroplasty with Dr. Addie at [facility] on 02/18/24. She is agreeable to case management. She anticipates returning home with assistance from her spouse and daughters. She was contacted by [employer], who is attempting to arrange shoulder CPM for patient prior to surgery. Unknown at this time if patient will need HHPT. Reviewed post op care instructions and will continue to follow for needs.                DME Arranged:  Continuous passive motion machine DME Agency:  [employer]  HH Arranged:    HH Agency:     Additional Comments: Please contact me with any questions of if this plan should need to change.  Tylene Ned, RN, BSN, [employer]  [phone] 02/16/2024, 5:09 PM

## 2024-02-18 ENCOUNTER — Encounter (HOSPITAL_COMMUNITY): Payer: Self-pay | Admitting: Orthopedic Surgery

## 2024-02-18 ENCOUNTER — Encounter (HOSPITAL_COMMUNITY)
Admission: RE | Disposition: A | Discharge status: Home / Self Care | Payer: Self-pay | Source: Home / Self Care | Attending: Orthopedic Surgery

## 2024-02-18 ENCOUNTER — Observation Stay (HOSPITAL_COMMUNITY)
Admission: RE | Admit: 2024-02-18 | Discharge: 2024-02-19 | Disposition: A | Discharge status: Home / Self Care | Attending: Orthopedic Surgery | Admitting: Orthopedic Surgery

## 2024-02-18 ENCOUNTER — Ambulatory Visit (HOSPITAL_COMMUNITY): Admitting: Anesthesiology

## 2024-02-18 ENCOUNTER — Other Ambulatory Visit: Payer: Self-pay

## 2024-02-18 ENCOUNTER — Observation Stay (HOSPITAL_COMMUNITY)

## 2024-02-18 DIAGNOSIS — M19012 Primary osteoarthritis, left shoulder: Secondary | ICD-10-CM

## 2024-02-18 DIAGNOSIS — Z01818 Encounter for other preprocedural examination: Principal | ICD-10-CM

## 2024-02-18 DIAGNOSIS — I10 Essential (primary) hypertension: Secondary | ICD-10-CM | POA: Insufficient documentation

## 2024-02-18 DIAGNOSIS — F418 Other specified anxiety disorders: Secondary | ICD-10-CM | POA: Diagnosis not present

## 2024-02-18 DIAGNOSIS — Z85828 Personal history of other malignant neoplasm of skin: Secondary | ICD-10-CM | POA: Insufficient documentation

## 2024-02-18 DIAGNOSIS — Z96612 Presence of left artificial shoulder joint: Secondary | ICD-10-CM

## 2024-02-18 DIAGNOSIS — M25512 Pain in left shoulder: Secondary | ICD-10-CM | POA: Diagnosis present

## 2024-02-18 HISTORY — PX: REVERSE SHOULDER ARTHROPLASTY: SHX5054

## 2024-02-18 LAB — BASIC METABOLIC PANEL WITH GFR
Anion gap: 10 (ref 5–15)
BUN: 15 mg/dL (ref 8–23)
CO2: 25 mmol/L (ref 22–32)
Calcium: 9.1 mg/dL (ref 8.9–10.3)
Chloride: 103 mmol/L (ref 98–111)
Creatinine, Ser: 0.81 mg/dL (ref 0.44–1.00)
GFR, Estimated: >60 mL/min
Glucose, Bld: 110 mg/dL — ABNORMAL HIGH (ref 70–99)
Potassium: 4.1 mmol/L (ref 3.5–5.1)
Sodium: 138 mmol/L (ref 135–145)

## 2024-02-18 LAB — VITAMIN D 25 HYDROXY (VIT D DEFICIENCY, FRACTURES): Vit D, 25-Hydroxy: 73.6 ng/mL (ref 30–100)

## 2024-02-18 MED ORDER — DIPHENHYDRAMINE HCL 25 MG PO CAPS
50.0000 mg | ORAL_CAPSULE | Freq: Every day | ORAL | Status: DC
  Administered 2024-02-18: 50 mg via ORAL
  Filled 2024-02-18: qty 1
  Filled 2024-02-18: qty 2

## 2024-02-18 MED ORDER — MENTHOL 3 MG MT LOZG: 1.0000 | LOZENGE | OROMUCOSAL | Status: DC | PRN

## 2024-02-18 MED ORDER — TRANEXAMIC ACID-NACL 1000-0.7 MG/100ML-% IV SOLN
1000.0000 mg | INTRAVENOUS | Status: AC
  Administered 2024-02-18: 1000 mg via INTRAVENOUS
  Filled 2024-02-18: qty 100

## 2024-02-18 MED ORDER — CITALOPRAM HYDROBROMIDE 10 MG PO TABS
10.0000 mg | ORAL_TABLET | Freq: Every day | ORAL | Status: DC
  Administered 2024-02-19: 10 mg via ORAL
  Filled 2024-02-18: qty 1

## 2024-02-18 MED ORDER — ROCURONIUM BROMIDE 10 MG/ML (PF) SYRINGE
PREFILLED_SYRINGE | INTRAVENOUS | Status: AC
  Filled 2024-02-18: qty 10

## 2024-02-18 MED ORDER — CALCIUM CARBONATE ANTACID 500 MG PO CHEW
600.0000 mg | CHEWABLE_TABLET | ORAL | Status: DC
  Administered 2024-02-19: 600 mg via ORAL
  Filled 2024-02-18: qty 3

## 2024-02-18 MED ORDER — METHOCARBAMOL 500 MG PO TABS: 500.0000 mg | ORAL_TABLET | Freq: Four times a day (QID) | ORAL | Status: DC | PRN

## 2024-02-18 MED ORDER — CEFAZOLIN SODIUM-DEXTROSE 2-4 GM/100ML-% IV SOLN
2.0000 g | Freq: Three times a day (TID) | INTRAVENOUS | Status: DC
  Administered 2024-02-18 – 2024-02-19 (×2): 2 g via INTRAVENOUS
  Filled 2024-02-18 (×2): qty 100

## 2024-02-18 MED ORDER — ONDANSETRON HCL 4 MG/2ML IJ SOLN: 4.0000 mg | Freq: Once | INTRAMUSCULAR | Status: DC | PRN

## 2024-02-18 MED ORDER — VANCOMYCIN HCL 1000 MG IV SOLR
INTRAVENOUS | Status: DC | PRN
  Administered 2024-02-18: 1000 mg

## 2024-02-18 MED ORDER — FENTANYL CITRATE (PF) 250 MCG/5ML IJ SOLN
INTRAMUSCULAR | Status: DC | PRN
  Administered 2024-02-18: 50 ug via INTRAVENOUS

## 2024-02-18 MED ORDER — POVIDONE-IODINE 7.5 % EX SOLN
Freq: Once | CUTANEOUS | Status: DC
  Filled 2024-02-18: qty 118

## 2024-02-18 MED ORDER — PHENYLEPHRINE 80 MCG/ML (10ML) SYRINGE FOR IV PUSH (FOR BLOOD PRESSURE SUPPORT)
PREFILLED_SYRINGE | INTRAVENOUS | Status: AC
  Filled 2024-02-18: qty 10

## 2024-02-18 MED ORDER — ROCURONIUM BROMIDE 10 MG/ML (PF) SYRINGE
PREFILLED_SYRINGE | INTRAVENOUS | Status: DC | PRN
  Administered 2024-02-18: 50 mg via INTRAVENOUS

## 2024-02-18 MED ORDER — ACETAMINOPHEN 500 MG PO TABS
1000.0000 mg | ORAL_TABLET | Freq: Once | ORAL | Status: AC
  Administered 2024-02-18: 1000 mg via ORAL
  Filled 2024-02-18: qty 2

## 2024-02-18 MED ORDER — PROPOFOL 500 MG/50ML IV EMUL
INTRAVENOUS | Status: DC | PRN
  Administered 2024-02-18: 160 ug/kg/min via INTRAVENOUS
  Administered 2024-02-18: 175 ug/kg/min via INTRAVENOUS

## 2024-02-18 MED ORDER — METOCLOPRAMIDE HCL 5 MG/ML IJ SOLN: 5.0000 mg | Freq: Three times a day (TID) | INTRAMUSCULAR | Status: DC | PRN

## 2024-02-18 MED ORDER — PROPOFOL 10 MG/ML IV BOLUS
INTRAVENOUS | Status: DC | PRN
  Administered 2024-02-18: 100 mg via INTRAVENOUS

## 2024-02-18 MED ORDER — IRRISEPT - 450ML BOTTLE WITH 0.05% CHG IN STERILE WATER, USP 99.95% OPTIME
TOPICAL | Status: DC | PRN
  Administered 2024-02-18: 450 mL via TOPICAL

## 2024-02-18 MED ORDER — HYDROMORPHONE HCL 1 MG/ML IJ SOLN: 0.5000 mg | INTRAMUSCULAR | Status: DC | PRN

## 2024-02-18 MED ORDER — CHLORHEXIDINE GLUCONATE 0.12 % MT SOLN
15.0000 mL | Freq: Once | OROMUCOSAL | Status: AC
  Administered 2024-02-18: 15 mL via OROMUCOSAL
  Filled 2024-02-18: qty 15

## 2024-02-18 MED ORDER — EPHEDRINE 5 MG/ML INJ
INTRAVENOUS | Status: AC
  Filled 2024-02-18: qty 5

## 2024-02-18 MED ORDER — LIDOCAINE 2% (20 MG/ML) 5 ML SYRINGE
INTRAMUSCULAR | Status: AC
  Filled 2024-02-18: qty 5

## 2024-02-18 MED ORDER — OXYCODONE HCL 5 MG/5ML PO SOLN: 5.0000 mg | Freq: Once | ORAL | Status: DC | PRN

## 2024-02-18 MED ORDER — SODIUM CHLORIDE 0.9 % IV SOLN: INTRAVENOUS | Status: DC

## 2024-02-18 MED ORDER — ONDANSETRON HCL 4 MG/2ML IJ SOLN
INTRAMUSCULAR | Status: DC | PRN
  Administered 2024-02-18: 4 mg via INTRAVENOUS

## 2024-02-18 MED ORDER — 0.9 % SODIUM CHLORIDE (POUR BTL) OPTIME
TOPICAL | Status: DC | PRN
  Administered 2024-02-18: 1000 mL
  Administered 2024-02-18: 4000 mL

## 2024-02-18 MED ORDER — ORAL CARE MOUTH RINSE: 15.0000 mL | Freq: Once | OROMUCOSAL | Status: AC

## 2024-02-18 MED ORDER — SUGAMMADEX SODIUM 200 MG/2ML IV SOLN
INTRAVENOUS | Status: DC | PRN
  Administered 2024-02-18: 75 mg via INTRAVENOUS
  Administered 2024-02-18: 125 mg via INTRAVENOUS

## 2024-02-18 MED ORDER — DOCUSATE SODIUM 100 MG PO CAPS
100.0000 mg | ORAL_CAPSULE | Freq: Two times a day (BID) | ORAL | Status: DC
  Administered 2024-02-18 – 2024-02-19 (×2): 100 mg via ORAL
  Filled 2024-02-18 (×2): qty 1

## 2024-02-18 MED ORDER — DIPHENHYDRAMINE HCL 50 MG/ML IJ SOLN
INTRAMUSCULAR | Status: DC | PRN
  Administered 2024-02-18: 12.5 mg via INTRAVENOUS

## 2024-02-18 MED ORDER — AMISULPRIDE (ANTIEMETIC) 5 MG/2ML IV SOLN: 10.0000 mg | Freq: Once | INTRAVENOUS | Status: DC | PRN

## 2024-02-18 MED ORDER — DEXAMETHASONE SOD PHOSPHATE PF 10 MG/ML IJ SOLN
INTRAMUSCULAR | Status: DC | PRN
  Administered 2024-02-18: 10 mg via INTRAVENOUS

## 2024-02-18 MED ORDER — SODIUM CHLORIDE (PF) 0.9 % IJ SOLN
INTRAMUSCULAR | Status: AC
  Filled 2024-02-18: qty 10

## 2024-02-18 MED ORDER — BUPIVACAINE LIPOSOME 1.3 % IJ SUSP
INTRAMUSCULAR | Status: DC | PRN
  Administered 2024-02-18: 10 mL via PERINEURAL

## 2024-02-18 MED ORDER — PROPRANOLOL HCL ER 60 MG PO CP24
120.0000 mg | ORAL_CAPSULE | Freq: Every day | ORAL | Status: DC
  Administered 2024-02-19: 120 mg via ORAL
  Filled 2024-02-18: qty 2

## 2024-02-18 MED ORDER — OXYCODONE HCL 5 MG PO TABS: 5.0000 mg | ORAL_TABLET | Freq: Once | ORAL | Status: DC | PRN

## 2024-02-18 MED ORDER — FENTANYL CITRATE (PF) 100 MCG/2ML IJ SOLN: 100.0000 ug | Freq: Once | INTRAMUSCULAR | Status: AC

## 2024-02-18 MED ORDER — ALBUTEROL SULFATE (2.5 MG/3ML) 0.083% IN NEBU: 2.5000 mg | INHALATION_SOLUTION | RESPIRATORY_TRACT | Status: DC | PRN

## 2024-02-18 MED ORDER — DEXAMETHASONE SOD PHOSPHATE PF 10 MG/ML IJ SOLN
INTRAMUSCULAR | Status: AC
  Filled 2024-02-18: qty 1

## 2024-02-18 MED ORDER — FENTANYL CITRATE (PF) 100 MCG/2ML IJ SOLN
INTRAMUSCULAR | Status: AC
  Administered 2024-02-18: 100 ug via INTRAVENOUS
  Filled 2024-02-18: qty 2

## 2024-02-18 MED ORDER — FENTANYL CITRATE (PF) 100 MCG/2ML IJ SOLN: 25.0000 ug | INTRAMUSCULAR | Status: DC | PRN

## 2024-02-18 MED ORDER — ACETAMINOPHEN 325 MG PO TABS: 325.0000 mg | ORAL_TABLET | Freq: Four times a day (QID) | ORAL | Status: DC | PRN

## 2024-02-18 MED ORDER — PHENYLEPHRINE HCL-NACL 20-0.9 MG/250ML-% IV SOLN
INTRAVENOUS | Status: DC | PRN
  Administered 2024-02-18: 40 ug/min via INTRAVENOUS

## 2024-02-18 MED ORDER — CALCIUM CARBONATE 1500 (600 CA) MG PO TABS: 600.0000 mg | ORAL_TABLET | ORAL | Status: DC

## 2024-02-18 MED ORDER — ACETAMINOPHEN 500 MG PO TABS
1000.0000 mg | ORAL_TABLET | Freq: Four times a day (QID) | ORAL | Status: DC
  Administered 2024-02-19 (×2): 1000 mg via ORAL
  Filled 2024-02-18 (×2): qty 2

## 2024-02-18 MED ORDER — METHOCARBAMOL 1000 MG/10ML IJ SOLN: 500.0000 mg | Freq: Four times a day (QID) | INTRAMUSCULAR | Status: DC | PRN

## 2024-02-18 MED ORDER — FENTANYL CITRATE (PF) 100 MCG/2ML IJ SOLN
INTRAMUSCULAR | Status: AC
  Filled 2024-02-18: qty 2

## 2024-02-18 MED ORDER — PHENOL 1.4 % MT LIQD: 1.0000 | OROMUCOSAL | Status: DC | PRN

## 2024-02-18 MED ORDER — LIDOCAINE 2% (20 MG/ML) 5 ML SYRINGE
INTRAMUSCULAR | Status: DC | PRN
  Administered 2024-02-18: 60 mg via INTRAVENOUS

## 2024-02-18 MED ORDER — POVIDONE-IODINE 10 % EX SWAB
2.0000 | Freq: Once | CUTANEOUS | Status: AC
  Administered 2024-02-18: 2 via TOPICAL

## 2024-02-18 MED ORDER — METOCLOPRAMIDE HCL 5 MG PO TABS: 5.0000 mg | ORAL_TABLET | Freq: Three times a day (TID) | ORAL | Status: DC | PRN

## 2024-02-18 MED ORDER — HYDROMORPHONE HCL 1 MG/ML IJ SOLN: 0.2500 mg | INTRAMUSCULAR | Status: DC | PRN

## 2024-02-18 MED ORDER — PHENYLEPHRINE 80 MCG/ML (10ML) SYRINGE FOR IV PUSH (FOR BLOOD PRESSURE SUPPORT)
PREFILLED_SYRINGE | INTRAVENOUS | Status: DC | PRN
  Administered 2024-02-18: 200 ug via INTRAVENOUS

## 2024-02-18 MED ORDER — OXYCODONE HCL 5 MG PO TABS: 5.0000 mg | ORAL_TABLET | ORAL | Status: DC | PRN

## 2024-02-18 MED ORDER — CEFAZOLIN SODIUM-DEXTROSE 2-4 GM/100ML-% IV SOLN
2.0000 g | INTRAVENOUS | Status: AC
  Administered 2024-02-18: 2 g via INTRAVENOUS
  Filled 2024-02-18: qty 100

## 2024-02-18 MED ORDER — ALBUTEROL SULFATE HFA 108 (90 BASE) MCG/ACT IN AERS: 2.0000 | INHALATION_SPRAY | Freq: Four times a day (QID) | RESPIRATORY_TRACT | Status: DC | PRN

## 2024-02-18 MED ORDER — ROPIVACAINE HCL 5 MG/ML IJ SOLN
INTRAMUSCULAR | Status: DC | PRN
  Administered 2024-02-18: 15 mL via PERINEURAL

## 2024-02-18 MED ORDER — VANCOMYCIN HCL 1000 MG IV SOLR
INTRAVENOUS | Status: AC
  Filled 2024-02-18: qty 20

## 2024-02-18 MED ORDER — PROPOFOL 10 MG/ML IV BOLUS
INTRAVENOUS | Status: AC
  Filled 2024-02-18: qty 20

## 2024-02-18 MED ORDER — ONDANSETRON HCL 4 MG PO TABS: 4.0000 mg | ORAL_TABLET | Freq: Four times a day (QID) | ORAL | Status: DC | PRN

## 2024-02-18 MED ORDER — LACTATED RINGERS IV SOLN: INTRAVENOUS | Status: DC

## 2024-02-18 MED ORDER — ONDANSETRON HCL 4 MG/2ML IJ SOLN
INTRAMUSCULAR | Status: AC
  Filled 2024-02-18: qty 2

## 2024-02-18 SURGICAL SUPPLY — 1 items: PIN STEINMANN THREADED TIP (PIN) IMPLANT

## 2024-02-18 NOTE — H&P (Signed)
 Rachael Bryant is an 74 y.o. female.   Chief Complaint: Left shoulder pain HPI:  Rachael Bryant is a 74 y.o. female who presents to the reporting continued left shoulder pain.  Had an injection in 04/16/2023 which gave her minimal relief.  She is left-hand dominant.  Not really taking any medications for pain.  Uses [employer].  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/2020   Left Alar Crease   PONV (postoperative nausea and vomiting)    vomiting every time after anesthesia    Past Surgical History:  Procedure Laterality Date   CATARACT EXTRACTION Bilateral    COLONOSCOPY WITH PROPOFOL  N/A 01/15/2021   Procedure: COLONOSCOPY WITH PROPOFOL ;  Surgeon: Eartha Angelia Sieving, MD;  Location: [facility] ENDO SUITE;  Service: Gastroenterology;  Laterality: N/A;  11:15   POLYPECTOMY  01/15/2021   Procedure: POLYPECTOMY;  Surgeon: Eartha Angelia Sieving, MD;  Location: [facility] ENDO SUITE;  Service: Gastroenterology;;   TUBAL LIGATION      Family History  Problem Relation Age of Onset   Heart attack Father    Diabetes Mother    Pulmonary fibrosis Mother    Parkinsonism Sister    Social History:  reports that she has never smoked. She has never used smokeless tobacco. She reports that she does not drink alcohol and does not use drugs.  Allergies: Allergies[1]  Medications Prior to Admission  Medication Sig Dispense Refill   albuterol  (VENTOLIN  HFA) 108 (90 Base) MCG/ACT inhaler Inhale 2 puffs into the lungs every 6 (six) hours as needed for wheezing or shortness of breath. 8 g 2   alendronate  (FOSAMAX ) 70 MG tablet Take 1 tablet (70 mg total) by mouth once a  week. 13 tablet 3   Calcium  Carbonate (CALCIUM  600 PO) Take 1 tablet by mouth 3 (three) times a week.     citalopram  (CELEXA ) 10 MG tablet TAKE 1 TABLET BY MOUTH EVERY DAY 90 tablet 1   diphenhydrAMINE  (BENADRYL ) 25 mg capsule Take 50 mg by mouth at bedtime.     Omega-3 Fatty Acids (FISH OIL) 1200 MG CAPS Take 1,200 mg by mouth 3 (three) times a week.     propranolol  ER (INDERAL  LA) 120 MG 24 hr capsule TAKE 1 CAPSULE BY MOUTH EVERY DAY 90 capsule 1   simvastatin  (ZOCOR ) 20 MG tablet Take 1 tablet (20 mg total) by mouth daily at 6 PM. 90 tablet 3   trimethoprim -polymyxin b  (POLYTRIM ) ophthalmic solution USE 1 DROP IN RIGHT EYE 4 TIMES DAILY FOR 2 DAYS AFTER EACH MONTHLY EYE INJECTION 10 mL 11   Vitamin D , Ergocalciferol , (DRISDOL ) 1.25 MG (50000 UNIT) CAPS capsule Take 1 capsule (50,000 Units total) by mouth every 7 (seven) days. 13 capsule 3   triamcinolone  cream (KENALOG ) 0.1 % APPLY TO AFFECTED AREA TWICE A DAY (Patient not taking: Reported on 02/08/2024) 60 g 0    No results found for this or any previous visit (from the past 48 hours). No results found.  Review of Systems  Musculoskeletal:  Positive for arthralgias.  All other systems reviewed and are negative.   Blood pressure (!) 167/87, pulse 80,  temperature 98.9 F (37.2 C), temperature source Oral, resp. rate 18, height 5' 7 (1.702 m), weight 81.6 kg, SpO2 95%. Physical Exam Vitals reviewed.  HENT:     Head: Normocephalic.     Nose: Nose normal.     Mouth/Throat:     Mouth: Mucous membranes are moist.  Eyes:     Pupils: Pupils are equal, round, and reactive to light.  Cardiovascular:     Rate and Rhythm: Normal rate.     Pulses: Normal pulses.  Pulmonary:     Effort: Pulmonary effort is normal.  Abdominal:     General: Abdomen is flat.  Musculoskeletal:     Cervical back: Normal range of motion.  Skin:    General: Skin is warm.     Capillary Refill: Capillary refill takes less than 2 seconds.  Neurological:      General: No focal deficit present.     Mental Status: She is alert.  Psychiatric:        Mood and Affect: Mood normal.     Ortho exam demonstrates range of motion on the left of 15/60/90.  External rotation strength 5 out of 5 on the left subscap strength 5+ out of 5 on the left.  Deltoid fires..  Radial pulse intact.  Motor or sensory function intact to the hand. Skin intact.  Assessment/Plan  Impression is end-stage arthritis left shoulder with significant impaction on activities of daily living.  Patient cannot really get her hand behind her head.  Severe arthritis is present.  Plan is left reverse shoulder replacement.  Risk and benefits are discussed with the patient include not limited to infection nerve vessel damage incomplete pain relief as well as incomplete restoration of function.  Instability also a risk.  The expected rehab time is discussed.  Patient is otherwise healthy.  Has no cardiac issues.  Patient understands risk and benefits and wishes to proceed.  We will likely aim for sometime in January.  All questions answered   Rachael Glendia Hutchinson, MD 02/18/2024, 8:37 AM       [1]  Allergies Allergen Reactions   Naproxen Sodium Hives   Ibuprofen Hives   Other Hives    NSAIDS   Codeine Nausea Only

## 2024-02-18 NOTE — Anesthesia Postprocedure Evaluation (Signed)
"   Anesthesia Post Note  Patient: Rachael Bryant  Procedure(s) Performed: LEFT REVERSE SHOULDER ARTHROPLASTY, BICEPS TENODESIS (Left: Shoulder)     Patient location during evaluation: PACU Anesthesia Type: Regional and General Level of consciousness: awake and alert, oriented and patient cooperative Pain management: pain level controlled Vital Signs Assessment: post-procedure vital signs reviewed and stable Respiratory status: spontaneous breathing, nonlabored ventilation and respiratory function stable Cardiovascular status: blood pressure returned to baseline and stable Postop Assessment: no apparent nausea or vomiting Anesthetic complications: no   No notable events documented.  Last Vitals:  Vitals:   02/18/24 1515 02/18/24 1545  BP: 134/74 (!) 142/86  Pulse: 70 71  Resp: 12 16  Temp: 36.7 C 36.8 C  SpO2: 94% 98%    Last Pain:  Vitals:   02/18/24 1515  TempSrc:   PainSc: 0-No pain                 [name]      "

## 2024-02-18 NOTE — Anesthesia Preprocedure Evaluation (Addendum)
"                                    Anesthesia Evaluation  Patient identified by MRN, date of birth, ID band Patient awake    Reviewed: Allergy & Precautions, H&P , NPO status , Patient's Chart, lab work & pertinent test results, reviewed documented beta blocker date and time   History of Anesthesia Complications (+) PONV and history of anesthetic complications  Airway Mallampati: III  TM Distance: >3 FB Neck ROM: Full    Dental  (+) Teeth Intact, Dental Advisory Given   Pulmonary neg pulmonary ROS   Pulmonary exam normal breath sounds clear to auscultation       Cardiovascular hypertension (167/87 preop, takes propanolol for tremor), Pt. on home beta blockers Normal cardiovascular exam Rhythm:Regular Rate:Normal     Neuro/Psych  PSYCHIATRIC DISORDERS Anxiety Depression    Essential tremor- propanolol     GI/Hepatic negative GI ROS, Neg liver ROS,,,  Endo/Other  negative endocrine ROS    Renal/GU negative Renal ROS  negative genitourinary   Musculoskeletal negative musculoskeletal ROS (+)    Abdominal   Peds negative pediatric ROS (+)  Hematology negative hematology ROS (+) Hb 14.6   Anesthesia Other Findings   Reproductive/Obstetrics negative OB ROS                              Anesthesia Physical Anesthesia Plan  ASA: 3  Anesthesia Plan: General and Regional   Post-op Pain Management: Regional block* and Tylenol  PO (pre-op)*   Induction: Intravenous  PONV Risk Score and Plan: Ondansetron , Dexamethasone , Propofol  infusion, TIVA, Treatment may vary due to age or medical condition and Midazolam  Airway Management Planned: Oral ETT  Additional Equipment: None  Intra-op Plan:   Post-operative Plan: Extubation in OR  Informed Consent: I have reviewed the patients History and Physical, chart, labs and discussed the procedure including the risks, benefits and alternatives for the proposed anesthesia with the  patient or authorized representative who has indicated his/her understanding and acceptance.     Dental advisory given  Plan Discussed with: CRNA  Anesthesia Plan Comments:          Anesthesia Quick Evaluation  "

## 2024-02-18 NOTE — Plan of Care (Signed)
   Problem: Education: Goal: Knowledge of General Education information will improve Description: Including pain rating scale, medication(s)/side effects and non-pharmacologic comfort measures Outcome: Progressing   Problem: Clinical Measurements: Goal: Will remain free from infection Outcome: Progressing   Problem: Activity: Goal: Risk for activity intolerance will decrease Outcome: Progressing   Problem: Nutrition: Goal: Adequate nutrition will be maintained Outcome: Progressing   Problem: Pain Managment: Goal: General experience of comfort will improve and/or be controlled Outcome: Progressing

## 2024-02-18 NOTE — Op Note (Unsigned)
 NAMEJOREEN, SWEARINGIN MEDICAL RECORD NO: [id number] ACCOUNT NO: [id number] DATE OF BIRTH: 1950/08/08 FACILITY: [facility] LOCATION: [facility] PHYSICIAN: Cordella RAMAN. Addie, MD  Operative Report   DATE OF PROCEDURE: 02/18/2024  PREOPERATIVE DIAGNOSES: 1. Left shoulder arthritis. 2. Rotator cuff arthropathy.  POSTOPERATIVE DIAGNOSES: 1. Left shoulder arthritis. 2. Rotator cuff arthropathy.  PROCEDURE:  Left reverse shoulder replacement using Bi[employer] components, small augmented base plate, 1 central compression screw, 4 peripheral locking screws with 36 plus 6 glenosphere, size 12 mini humeral stem with plus 6 tapered offset humeral tray, and  plus 3 thickness 36 mm bearing.  SURGEON:  Cordella RAMAN. Addie, MD  ASSISTANT:  Herlene Calix, PA  INDICATIONS:  The patient is a 74 year old patient with severe end-stage arthritis in the shoulder who presents for operative management after explanation of the risks and benefits.  DESCRIPTION OF PROCEDURE:  The patient was brought to the operating room where general anesthetic was induced. Preoperative antibiotics were administered.  A time-out was called.  The patient was placed in the beach chair position with the head in a  neutral position.  The left shoulder, arm, and hand were prescrubbed with hydrogen peroxide followed by alcohol and then Betadine , which was allowed to air dry, and then ChloraPrep solution.  Ioban was used to cover the operative field.  Preop range of  motion was limited to about 5 degrees of external rotation, 70 of abduction, and about 85-90 of forward flexion passively.  After sterile prepping and draping, a time-out was called.  A deltopectoral approach was made.  About a 10 cm incision was  utilized.  The cephalic vein was mobilized laterally.  IrriSept solution was utilized.  The anterior portion of the deltoid attachment was released manually as a sleeve.  The axillary nerve was palpated and protected at all times during the case.   The  upper 1.5 cm of the pec was released.  Biceps tenodesis was performed with 5-0 Vicryl sutures.  The rotator interval was then opened along with the transverse humeral ligament after tagging the end of the biceps tendon and mobilizing it proximally.  The  rotator interval was opened up to the base of the coracoid.  Circumflex vessels were ligated.  The subscap was removed with a 15 blade from the lesser tuberosity, and that dissection was taken down on bone about 2 cm below the humeral neck.  This was  taken around to the 7 o'clock position with progressive external rotation of the arm.  Osteophytes were removed.  The inferior portion of the dissection was performed bluntly with a small Cobb elevator and a sponge.  Next, after removing the osteophytes with an osteotome, the head was dislocated.  The Brown retractor was placed.  Reaming was performed up to a size 12, and the head was cut in 30 degrees of retroversion.  Broaching was then performed up to a size 12  with good press-fit obtained.  A cap was placed, and attention was directed towards the glenoid.  The anterior and posterior retractors were released with the patient's muscle relaxer reversed.  Circumferential removal and release of the labrum was  performed.  The guide was in place, and 2 pins were placed.  Reaming was then performed in accordance with preoperative templating up to 5.5 mm inferiorly.  Posterior reaming was then performed for the small augmented base plate.  Full contact was  achieved with the trial.  Next, thorough irrigation with IrriSept solution was performed, and the true base plate was  placed with good fixation achieved with 1 central compression screw and 4 peripheral locking screws.  At this time, trial reduction was  performed with various glenosphere sizes and humeral tray sizes.  The optimal construct was a plus 6 glenosphere with 1.5 mm of posterior inferior offset and the plus 6 humeral tray with the plus 3 liner.   This gave excellent stability to extension,  abduction, and a forward force along with internal and external rotation at 90 degrees of abduction with a distraction force.  It was difficult to reduce and difficult to redislocate.  Two fingers tight.  Dislocation was performed, the trial broach was  removed, and 6 suture tapes were placed in the lesser tuberosity for subscap reattachment.  Next, the canal was irrigated with IrriSept solution.  The trial glenosphere was removed, and the true glenosphere was placed onto the base plate, which had a  dried Morse taper.  Excellent fit was obtained.  Next, the true stem was placed after suctioning out the IrriSept and placing in some vancomycin  powder into the canal.  A repeat trial reduction was performed with the plus 0 spacer and the plus 3 spacer  on the plus 6 humeral offset tray.  Optimal stability was with the plus 3 spacer.  This was placed, and the shoulder was reduced.  The patient had the same stability parameters.  The axillary nerve was again palpated and was intact.  3 L of pouring  irrigation was then utilized.  The subscap was closed first prior to irrigating with IrriSept.  That was done with the arm in 30 degrees of external rotation.  Prior to closing the rotator interval with #1 Vicryl suture, we did irrigate with IrriSept  again on the implant and placed vancomycin  powder on the implant.  The rotator interval was then closed at 30 degrees of external rotation.  Irrigation was again performed, vancomycin  powder was placed on top of the capsular closure, and then the  deltopectoral interval was closed using #1 Vicryl suture, followed by interrupted inverted 0 Vicryl suture, 2-0 Vicryl suture, and 3-0 Monocryl with Steri-Strips.  Aquacel dressing was applied.  The patient tolerated the procedure well without immediate  complications and was transferred to the recovery room in stable condition.  A sling was also applied.  Luke's assistance was  required at all times for retraction, opening, closing, and mobilization of tissue.  His assistance was a medical necessity.   PUS D: 02/18/2024 1:01:59 pm T: 02/18/2024 3:33:00 pm  JOB: [id number]

## 2024-02-18 NOTE — Brief Op Note (Signed)
" ° °  02/18/2024  12:53 PM  PATIENT:  Rachael Bryant  74 y.o. female  PRE-OPERATIVE DIAGNOSIS:  left rotator cuff arthropathy  POST-OPERATIVE DIAGNOSIS:  left rotator cuff arthropathy  PROCEDURE:  Procedures: LEFT REVERSE SHOULDER ARTHROPLASTY, BICEPS TENODESIS  SURGEON:  Surgeon(s): Addie, Cordella Hamilton, MD  ASSISTANT: Magnant PA  ANESTHESIA:   General  EBL: 50 ml    Total I/O In: 1200 [I.V.:1000; IV Piggyback:200] Out: 75 [Blood:75]  BLOOD ADMINISTERED: none  DRAINS: None  LOCAL MEDICATIONS USED: Vancomycin  powder  SPECIMEN:  No Specimen  COUNTS:  YES  TOURNIQUET:  * No tourniquets in log *  DICTATION: .Other Dictation: Dictation Number [id number]  PLAN OF CARE: Admit for overnight observation  PATIENT DISPOSITION:  PACU - hemodynamically stable              "

## 2024-02-18 NOTE — Anesthesia Procedure Notes (Signed)
 Procedure Name: Intubation Date/Time: 02/18/2024 9:38 AM  Performed by: Roslynn Waddell LABOR, CRNAPre-anesthesia Checklist: Patient identified, Emergency Drugs available, Suction available and Patient being monitored Patient Re-evaluated:Patient Re-evaluated prior to induction Oxygen Delivery Method: Circle System Utilized Preoxygenation: Pre-oxygenation with 100% oxygen Induction Type: IV induction Ventilation: Mask ventilation without difficulty Laryngoscope Size: Mac and 3 Grade View: Grade I Tube type: Oral Tube size: 7.0 mm Number of attempts: 1 Airway Equipment and Method: Stylet and Oral airway Placement Confirmation: ETT inserted through vocal cords under direct vision, positive ETCO2 and breath sounds checked- equal and bilateral Secured at: 21 cm Tube secured with: Tape Dental Injury: Teeth and Oropharynx as per pre-operative assessment  Comments: Atraumatic induction/intubation. Dentition and oral mucosa as per preop.

## 2024-02-18 NOTE — Transfer of Care (Signed)
 Immediate Anesthesia Transfer of Care Note  Patient: Rachael Bryant  Procedure(s) Performed: LEFT REVERSE SHOULDER ARTHROPLASTY, BICEPS TENODESIS (Left: Shoulder)  Patient Location: PACU  Anesthesia Type:General and Regional  Level of Consciousness: awake  Airway & Oxygen Therapy: Patient Spontanous Breathing and Patient connected to face mask oxygen  Post-op Assessment: Report given to RN and Post -op Vital signs reviewed and stable  Post vital signs: Reviewed and stable  Last Vitals:  Vitals Value Taken Time  BP 119/62 02/18/24 12:58  Temp    Pulse 73 02/18/24 13:00  Resp 14 02/18/24 13:00  SpO2 98 % 02/18/24 13:00  Vitals shown include unfiled device data.  Last Pain:  Vitals:   02/18/24 0859  TempSrc:   PainSc: 3       Patients Stated Pain Goal: 0 (02/18/24 0839)  Complications: No notable events documented.

## 2024-02-18 NOTE — Anesthesia Procedure Notes (Signed)
 Anesthesia Regional Block: Interscalene brachial plexus block   Pre-Anesthetic Checklist: , timeout performed,  Correct Patient, Correct Site, Correct Laterality,  Correct Procedure, Correct Position, site marked,  Risks and benefits discussed,  Surgical consent,  Pre-op evaluation,  At surgeon's request and post-op pain management  Laterality: Left  Prep: Maximum Sterile Barrier Precautions used, chloraprep       Needles:  Injection technique: Single-shot  Needle Type: Echogenic Stimulator Needle     Needle Length: 9cm  Needle Gauge: 22     Additional Needles:   Procedures:,,,, ultrasound used (permanent image in chart),,    Narrative:  Start time: 02/18/2024 9:00 AM End time: 02/18/2024 9:05 AM Injection made incrementally with aspirations every 5 mL.  Performed by: Personally  Anesthesiologist: Merla Almarie HERO, DO  Additional Notes: Monitors applied. No increased pain on injection. No increased resistance to injection. Injection made in 5cc increments. Good needle visualization. Patient tolerated procedure well.

## 2024-02-19 DIAGNOSIS — M19012 Primary osteoarthritis, left shoulder: Secondary | ICD-10-CM | POA: Diagnosis not present

## 2024-02-19 MED ORDER — METHOCARBAMOL 500 MG PO TABS: 500.0000 mg | ORAL_TABLET | Freq: Three times a day (TID) | ORAL | 0 refills | Status: AC | PRN

## 2024-02-19 MED ORDER — OXYCODONE HCL 5 MG PO TABS: 5.0000 mg | ORAL_TABLET | ORAL | 0 refills | Status: AC | PRN

## 2024-02-19 MED ORDER — DOCUSATE SODIUM 100 MG PO CAPS: 100.0000 mg | ORAL_CAPSULE | Freq: Two times a day (BID) | ORAL | 0 refills | Status: AC

## 2024-02-19 NOTE — Care Management (Signed)
 Transition of Care [facility]) - Inpatient Brief Assessment   Patient Details  Name: Rachael Bryant MRN: [id number] Date of Birth: January 24, 1951  Transition of Care [facility]) CM/SW Contact:    Corean JAYSON Canary, RN Phone Number: 02/19/2024, 9:02 AM   Clinical Narrative: Patient presented with Left reverse shoulder arthroplasty  Spoke to patient via phone for discharge planning. Home health orders received. She would like a agency that works best with her insurance, she has no preference- spoke about choices on [email or web address] would like a highly rated agency Placed in HUB for acceptance. OT working with patient now.   Will place [facility] on AVS no DME needed per patient.   Transition of Care Asessment: Insurance and Status: Insurance coverage has been reviewed Patient has primary care physician: Yes Home environment has been reviewed: [facility]  with [facility] Prior level of function:: Independent Prior/Current Home Services: No current home services Social Drivers of Health Review: SDOH reviewed no interventions necessary Readmission risk has been reviewed: Yes Transition of care needs: transition of care needs identified, TOC will continue to follow

## 2024-02-19 NOTE — Progress Notes (Signed)
" °  Subjective: Patient stable.  Pain controlled but the block is not yet worn off.   Objective: Vital signs in last 24 hours: Temp:  [97.7 F (36.5 C)-98.9 F (37.2 C)] 97.8 F (36.6 C) (01/09 0336) Pulse Rate:  [64-85] 70 (01/09 0336) Resp:  [10-20] 18 (01/09 0336) BP: (109-187)/(50-110) 109/50 (01/09 0336) SpO2:  [93 %-100 %] 98 % (01/09 0336) Weight:  [81.6 kg] 81.6 kg (01/08 0821)  Intake/Output from previous day: 01/08 0701 - 01/09 0700 In: 1200 [I.V.:1000; IV Piggyback:200] Out: 75 [Blood:75] Intake/Output this shift: No intake/output data recorded.  Exam:  Intact pulses distally No cellulitis present Compartment soft  Labs: No results for input(s): HGB in the last 72 hours. No results for input(s): WBC, RBC, HCT, PLT in the last 72 hours. Recent Labs    02/18/24 0850  NA 138  K 4.1  CL 103  CO2 25  BUN 15  CREATININE 0.81  GLUCOSE 110*  CALCIUM  9.1   No results for input(s): LABPT, INR in the last 72 hours.  Assessment/Plan: Plan at this time is occupational therapy this morning discharge to home thereafter.  She is okay to do a home exercise program of pendulum exercises which she can learn from occupational therapy.  Follow-up in 2 weeks.   Rachael Bryant 02/19/2024, 7:50 AM     "

## 2024-02-19 NOTE — Evaluation (Signed)
 Occupational Therapy Evaluation Patient Details Name: Rachael Bryant MRN: [id number] DOB: February 27, 1950 Today's Date: 02/19/2024   History of Present Illness   Patient is a 74 yo female presenting to the hospital on 1/8 for a reverse total shoulder. No PMH on file.     Clinical Impressions Prior to this admission, patient fully independent, driving, and extremely active at baseline. Currently, patient with no pain in shoulder, but decreased sensation. All education provided per Dr. Brion specifics and completed exercises for elbow, wrist, and hand. OT demonstrating pendulums, with patient deferring due to current decreased sensation. Patient min A for ADLs, however will have assist at home from family. Patient mod I for functional mobility. OT will continue to follow acutely, and will defer to MD for shoulder progression.      If plan is discharge home, recommend the following:   A little help with bathing/dressing/bathroom;Assist for transportation     Functional Status Assessment   Patient has had a recent decline in their functional status and demonstrates the ability to make significant improvements in function in a reasonable and predictable amount of time.     Equipment Recommendations   None recommended by OT     Recommendations for Other Services         Precautions/Restrictions   Precautions Precautions: Shoulder Type of Shoulder Precautions: NWB, ok for shoulder pendulums, exercise elbow wrist and hand Shoulder Interventions: Shoulder sling/immobilizer;At all times;Off for dressing/bathing/exercises Precaution Booklet Issued: Yes (comment) Recall of Precautions/Restrictions: Intact Required Braces or Orthoses: Sling Restrictions Weight Bearing Restrictions Per Provider Order: Yes LUE Weight Bearing Per Provider Order: Non weight bearing     Mobility Bed Mobility Overal bed mobility: Modified Independent                  Transfers Overall  transfer level: Modified independent                        Balance Overall balance assessment: Modified Independent                                         ADL either performed or assessed with clinical judgement   ADL Overall ADL's : Needs assistance/impaired Eating/Feeding: Set up;Sitting   Grooming: Set up;Sitting   Upper Body Bathing: Minimal assistance;Cueing for UE precautions   Lower Body Bathing: Minimal assistance;Sitting/lateral leans;Sit to/from stand   Upper Body Dressing : Moderate assistance;Sitting   Lower Body Dressing: Minimal assistance;Sitting/lateral leans;Sit to/from stand   Toilet Transfer: Modified Independent   Toileting- Clothing Manipulation and Hygiene: Contact guard assist;Sit to/from stand;Sitting/lateral lean       Functional mobility during ADLs: Minimal assistance;Cueing for safety;Cueing for sequencing General ADL Comments: Prior to this admission, patient fully independent, driving, and extremely active at baseline. Currently, patient with no pain in shoulder, but decreased sensation. All education provided per Dr. Brion specifics and completed exercises for elbow, wrist, and hand. OT demonstrating pendulums, with patient deferring due to current decreased sensation. Patient min A for ADLs, however will have assist at home from family. Patient mod I for functional mobility. OT will continue to follow acutely, and will defer to MD for shoulder progression.     Vision Baseline Vision/History: 1 Wears glasses Ability to See in Adequate Light: 0 Adequate Patient Visual Report: No change from baseline Vision Assessment?: Wears glasses for reading  Perception Perception: Within Functional Limits       Praxis Praxis: WFL       Pertinent Vitals/Pain Pain Assessment Pain Assessment: No/denies pain     Extremity/Trunk Assessment Upper Extremity Assessment Upper Extremity Assessment: Left hand dominant;LUE  deficits/detail LUE Deficits / Details: Decreased sensation LUE, decreased AROM, able to complete elbow, wrist, and hand exercises with AAROM completed, educated on pendulums LUE: Unable to fully assess due to immobilization LUE Sensation: decreased light touch LUE Coordination: decreased fine motor;decreased gross motor   Lower Extremity Assessment Lower Extremity Assessment: Overall WFL for tasks assessed   Cervical / Trunk Assessment Cervical / Trunk Assessment: Normal   Communication Communication Communication: No apparent difficulties   Cognition Arousal: Alert Behavior During Therapy: WFL for tasks assessed/performed Cognition: No apparent impairments                               Following commands: Intact       Cueing  General Comments   Cueing Techniques: Verbal cues      Exercises     Shoulder Instructions      Home Living Family/patient expects to be discharged to:: Private residence Living Arrangements: Spouse/significant other Available Help at Discharge: Available 24 hours/day Type of Home: House Home Access: Stairs to enter [employer] of Steps: 3 Entrance Stairs-Rails: Can reach both Home Layout: Two level;Full bath on main level;Able to live on main level with bedroom/bathroom Alternate Level Stairs-Number of Steps: 14   Bathroom Shower/Tub: Producer, Television/film/video: Handicapped height     Home Equipment: Information systems manager - built in          Prior Functioning/Environment Prior Level of Function : Independent/Modified Independent;Driving             Mobility Comments: independent ADLs Comments: independent    OT Problem List: Decreased strength;Decreased range of motion;Impaired UE functional use;Impaired sensation   OT Treatment/Interventions: Therapeutic exercise;Self-care/ADL training;Energy conservation;DME and/or AE instruction;Therapeutic activities;Patient/family education;Balance  training;Modalities      OT Goals(Current goals can be found in the care plan section)   Acute Rehab OT Goals Patient Stated Goal: to get better OT Goal Formulation: With patient Time For Goal Achievement: 03/04/24 Potential to Achieve Goals: Good   OT Frequency:  Min 2X/week    Co-evaluation              AM-PAC OT 6 Clicks Daily Activity     Outcome Measure Help from another person eating meals?: A Little Help from another person taking care of personal grooming?: A Little Help from another person toileting, which includes using toliet, bedpan, or urinal?: A Little Help from another person bathing (including washing, rinsing, drying)?: A Little Help from another person to put on and taking off regular upper body clothing?: A Little Help from another person to put on and taking off regular lower body clothing?: A Little 6 Click Score: 18   End of Session Equipment Utilized During Treatment: Other (comment) (Sling) Nurse Communication: Mobility status  Activity Tolerance: Patient tolerated treatment well Patient left: with nursing/sitter in room  OT Visit Diagnosis: Muscle weakness (generalized) (M62.81)                Time: 9169-9084 OT Time Calculation (min): 45 min Charges:  OT General Charges $OT Visit: 1 Visit OT Evaluation $OT Eval Moderate Complexity: 1 Mod OT Treatments $Self Care/Home Management : 23-37 mins  Ronal Gift E.  Hser Belanger, OTR/L Acute Rehabilitation Services [phone]   Ronal Mallie Salt 02/19/2024, 12:42 PM

## 2024-02-19 NOTE — Plan of Care (Signed)
" °  Problem: Education: Goal: Knowledge of General Education information will improve Description: Including pain rating scale, medication(s)/side effects and non-pharmacologic comfort measures Outcome: Adequate for Discharge   Problem: Health Behavior/Discharge Planning: Goal: Ability to manage health-related needs will improve Outcome: Adequate for Discharge   Problem: Clinical Measurements: Goal: Ability to maintain clinical measurements within normal limits will improve Outcome: Adequate for Discharge Goal: Will remain free from infection Outcome: Adequate for Discharge Goal: Diagnostic test results will improve Outcome: Adequate for Discharge Goal: Respiratory complications will improve Outcome: Adequate for Discharge Goal: Cardiovascular complication will be avoided Outcome: Adequate for Discharge   Problem: Activity: Goal: Risk for activity intolerance will decrease Outcome: Adequate for Discharge   Problem: Nutrition: Goal: Adequate nutrition will be maintained Outcome: Adequate for Discharge   Problem: Coping: Goal: Level of anxiety will decrease Outcome: Adequate for Discharge   Problem: Elimination: Goal: Will not experience complications related to bowel motility Outcome: Adequate for Discharge Goal: Will not experience complications related to urinary retention Outcome: Adequate for Discharge   Problem: Pain Managment: Goal: General experience of comfort will improve and/or be controlled Outcome: Adequate for Discharge   Problem: Safety: Goal: Ability to remain free from injury will improve Outcome: Adequate for Discharge   Problem: Skin Integrity: Goal: Risk for impaired skin integrity will decrease Outcome: Adequate for Discharge   Problem: Education: Goal: Knowledge of the prescribed therapeutic regimen will improve Outcome: Adequate for Discharge   Problem: Bowel/Gastric: Goal: Gastrointestinal status for postoperative course will  improve Outcome: Adequate for Discharge   Problem: Cardiac: Goal: Ability to maintain an adequate cardiac output Outcome: Adequate for Discharge Goal: Will show no evidence of cardiac arrhythmias Outcome: Adequate for Discharge   Problem: Nutritional: Goal: Will attain and maintain optimal nutritional status Outcome: Adequate for Discharge   Problem: Neurological: Goal: Will regain or maintain usual level of consciousness Outcome: Adequate for Discharge   Problem: Clinical Measurements: Goal: Ability to maintain clinical measurements within normal limits Outcome: Adequate for Discharge Goal: Postoperative complications will be avoided or minimized Outcome: Adequate for Discharge   Problem: Respiratory: Goal: Will regain and/or maintain adequate ventilation Outcome: Adequate for Discharge Goal: Respiratory status will improve Outcome: Adequate for Discharge   Problem: Skin Integrity: Goal: Demonstrates signs of wound healing without infection Outcome: Adequate for Discharge   Problem: Urinary Elimination: Goal: Will remain free from infection Outcome: Adequate for Discharge Goal: Ability to achieve and maintain adequate urine output Outcome: Adequate for Discharge   Problem: Education: Goal: Knowledge of the prescribed therapeutic regimen will improve Outcome: Adequate for Discharge Goal: Understanding of activity limitations/precautions following surgery will improve Outcome: Adequate for Discharge Goal: Individualized Educational Video(s) Outcome: Adequate for Discharge   Problem: Activity: Goal: Ability to tolerate increased activity will improve Outcome: Adequate for Discharge   Problem: Pain Management: Goal: Pain level will decrease with appropriate interventions Outcome: Adequate for Discharge   "

## 2024-02-19 NOTE — Plan of Care (Signed)
   Problem: Activity: Goal: Risk for activity intolerance will decrease Outcome: Progressing   Problem: Safety: Goal: Ability to remain free from injury will improve Outcome: Progressing   Problem: Skin Integrity: Goal: Risk for impaired skin integrity will decrease Outcome: Progressing

## 2024-02-22 ENCOUNTER — Encounter (HOSPITAL_COMMUNITY): Payer: Self-pay | Admitting: Orthopedic Surgery

## 2024-02-23 ENCOUNTER — Telehealth: Payer: Self-pay | Admitting: Orthopedic Surgery

## 2024-02-23 NOTE — Telephone Encounter (Signed)
 Vachiria called. She is the OT working in home with patient. She would like verbal OT orders 1x wk 1wk 2x 3wk 1x 4wk. Also would like protocol faxed to [phone]

## 2024-02-24 ENCOUNTER — Ambulatory Visit: Payer: Self-pay

## 2024-02-24 ENCOUNTER — Encounter (HOSPITAL_COMMUNITY): Payer: Self-pay | Admitting: Orthopedic Surgery

## 2024-02-24 ENCOUNTER — Telehealth: Payer: Self-pay | Admitting: Orthopedic Surgery

## 2024-02-24 NOTE — Telephone Encounter (Signed)
 FYI Only or Action Required?: FYI only for provider: Home care advised.  Patient was last seen in primary care on 07/15/2023 by [name], MD.  Called Nurse Triage reporting Sore Throat and Cough.  Symptoms began yesterday.  Interventions attempted: OTC medications: Tylenol , lozenges.  Symptoms are: gradually worsening.  Triage Disposition: Home Care  Patient/caregiver understands and will follow disposition?: Yes   Yesterday onset of cough with green mucus and sore throat with congestion. No fever or SOB or CP. Home care advised, recommended trying mucinex  and/or robitussin, and sudafed. Advised to call back for worsening symptoms or for symptoms that last longer than 10 days.    Copied from CRM [id number]. Topic: Clinical - Red Word Triage >> Feb 24, 2024 11:01 AM Rachael Bryant wrote: Red Word that prompted transfer to Nurse Triage: Colored phlegm, just had shoulder surgery a week ago. Sore throat, chest congestion, loss/hoarse voice. Reason for Disposition  Cough  Answer Assessment - Initial Assessment Questions 1. ONSET: When did the cough begin?      Yesterday  2. SEVERITY: How bad is the cough today?      Mild  3. SPUTUM: Describe the color of your sputum (e.g., none, dry cough; clear, white, yellow, green)     Green  4. HEMOPTYSIS: Are you coughing up any blood? If Yes, ask: How much? (e.g., flecks, streaks, tablespoons, etc.)     Denies  5. DIFFICULTY BREATHING: Are you having difficulty breathing? If Yes, ask: How bad is it? (e.g., mild, moderate, severe)      Denies  6. FEVER: Do you have a fever? If Yes, ask: What is your temperature, how was it measured, and when did it start?     Denies  7. CARDIAC HISTORY: Do you have any history of heart disease? (e.g., heart attack, congestive heart failure)      Denies  8. LUNG HISTORY: Do you have any history of lung disease?  (e.g., pulmonary embolus, asthma, emphysema)     Denies  9. PE RISK  FACTORS: Do you have a history of blood clots? (or: recent major surgery, recent prolonged travel, bedridden)     Denies  10. OTHER SYMPTOMS: Do you have any other symptoms? (e.g., runny nose, wheezing, chest pain)       Sore throat, chest congestion  Protocols used: Cough - Acute Productive-A-AH

## 2024-02-24 NOTE — Telephone Encounter (Signed)
 Noted

## 2024-02-25 NOTE — Telephone Encounter (Signed)
 Number provided is incorrect. Unable to reach.

## 2024-02-25 NOTE — Telephone Encounter (Signed)
 Active assisted and passive range of motion as tolerated.  No external rotation beyond 45 degrees.  Okay to start strengthening 6 weeks postop.  We will discontinue the sling 2 weeks post op

## 2024-02-25 NOTE — Telephone Encounter (Signed)
 Patient is okay for passive range of motion and active assisted range of motion for full abduction and forward elevation but only external rotation to a max of 30 degrees and no internal rotation behind the back for the first 2 weeks.  Okay for deltoid isometric exercises but no isotonic exercises or lifting with the operative extremity.

## 2024-02-26 ENCOUNTER — Encounter (INDEPENDENT_AMBULATORY_CARE_PROVIDER_SITE_OTHER): Admitting: Ophthalmology

## 2024-02-26 ENCOUNTER — Other Ambulatory Visit: Payer: Self-pay | Admitting: *Deleted

## 2024-02-26 ENCOUNTER — Telehealth: Payer: Self-pay | Admitting: *Deleted

## 2024-02-26 DIAGNOSIS — Z96612 Presence of left artificial shoulder joint: Secondary | ICD-10-CM

## 2024-02-26 NOTE — Telephone Encounter (Signed)
 Okay to start outpatient at 2 weeks

## 2024-02-28 DIAGNOSIS — M19012 Primary osteoarthritis, left shoulder: Secondary | ICD-10-CM

## 2024-02-28 NOTE — Discharge Summary (Signed)
 Physician Discharge Summary      Patient ID: Rachael Bryant MRN: [id number] DOB/AGE: [birth year] 74 y.o.  Admit date: 02/18/2024 Discharge date: 02/19/2024  Admission Diagnoses:  Principal Problem:   S/P reverse total shoulder arthroplasty, left   Discharge Diagnoses:  Same  Surgeries: Procedures: LEFT REVERSE SHOULDER ARTHROPLASTY, BICEPS TENODESIS on 02/18/2024   Consultants:   Discharged Condition: Stable  Hospital Course: Rachael Bryant is an 74 y.o. 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  They were brought to the operating room on 02/18/2024 and underwent the above named procedures.  Pt awoke from anesthesia without complication and was transferred to the floor. On POD1, patient's pain was overall controlled.  No red flag signs or symptoms throughout her stay.  Discharged home on POD 1..  Pt will f/u with Dr. Addie in clinic in ~2 weeks.   Antibiotics given:  Anti-infectives (From admission, onward)    Start     Dose/Rate Route Frequency Ordered Stop   02/18/24 2200  ceFAZolin  (ANCEF ) IVPB 2g/100 mL premix  Status:  Discontinued        2 g 200 mL/hr over 30 Minutes Intravenous Every 8 hours 02/18/24 2035 02/19/24 1432   02/18/24 0920  vancomycin  (VANCOCIN ) powder  Status:  Discontinued          As needed 02/18/24 0921 02/18/24 1255   02/18/24 0845  ceFAZolin  (ANCEF ) IVPB 2g/100 mL premix        2 g 200 mL/hr over 30 Minutes Intravenous On call to O.R. 02/18/24 9166 02/18/24 0955     .  Recent vital signs:  Vitals:   02/19/24 0336 02/19/24 0812  BP: (!) 109/50 123/68  Pulse: 70 78  Resp: 18 16  Temp: 97.8 F (36.6 C) 97.8 F (36.6 C)  SpO2: 98% 97%    Recent laboratory studies:  Results for orders placed or performed during the hospital encounter of 02/18/24  VITAMIN D  25 Hydroxy (Vit-D Deficiency, Fractures)   Collection Time: 02/18/24  8:50 AM  Result Value  Ref Range   Vit D, 25-Hydroxy 73.6 30 - 100 ng/mL  Basic metabolic panel   Collection Time: 02/18/24  8:50 AM  Result Value Ref Range   Sodium 138 135 - 145 mmol/L   Potassium 4.1 3.5 - 5.1 mmol/L   Chloride 103 98 - 111 mmol/L   CO2 25 22 - 32 mmol/L   Glucose, Bld 110 (H) 70 - 99 mg/dL   BUN 15 8 - 23 mg/dL   Creatinine, Ser 9.18 0.44 - 1.00 mg/dL   Calcium  9.1 8.9 - 10.3 mg/dL   GFR, Estimated >39 >39 mL/min   Anion gap 10 5 - 15    Discharge Medications:   Allergies as of 02/19/2024       Reactions   Naproxen Sodium Hives   Ibuprofen Hives   Other Hives   NSAIDS   Codeine Nausea Only        Medication List     TAKE these medications    albuterol  108 (90 Base) MCG/ACT inhaler Commonly known as: VENTOLIN  HFA Inhale 2 puffs into the lungs every 6 (six) hours as needed for wheezing or shortness of breath.   alendronate  70 MG tablet Commonly known as: FOSAMAX  Take 1 tablet (70 mg total) by mouth once a week.   CALCIUM  600 PO Take 1 tablet by mouth 3 (three) times a week.  citalopram  10 MG tablet Commonly known as: CELEXA  TAKE 1 TABLET BY MOUTH EVERY DAY   diphenhydrAMINE  25 mg capsule Commonly known as: BENADRYL  Take 50 mg by mouth at bedtime.   docusate sodium  100 MG capsule Commonly known as: COLACE Take 1 capsule (100 mg total) by mouth 2 (two) times daily.   Fish Oil 1200 MG Caps Take 1,200 mg by mouth 3 (three) times a week.   methocarbamol  500 MG tablet Commonly known as: ROBAXIN  Take 1 tablet (500 mg total) by mouth every 8 (eight) hours as needed for muscle spasms.   oxyCODONE  5 MG immediate release tablet Commonly known as: Oxy IR/ROXICODONE  Take 1-2 tablets (5-10 mg total) by mouth every 4 (four) hours as needed for moderate pain (pain score 4-6) (pain score 4-6).   propranolol  ER 120 MG 24 hr capsule Commonly known as: INDERAL  LA TAKE 1 CAPSULE BY MOUTH EVERY DAY   simvastatin  20 MG tablet Commonly known as: ZOCOR  Take 1 tablet (20  mg total) by mouth daily at 6 PM.   triamcinolone  cream 0.1 % Commonly known as: KENALOG  APPLY TO AFFECTED AREA TWICE A DAY   trimethoprim -polymyxin b  ophthalmic solution Commonly known as: POLYTRIM  USE 1 DROP IN RIGHT EYE 4 TIMES DAILY FOR 2 DAYS AFTER EACH MONTHLY EYE INJECTION   Vitamin D  (Ergocalciferol ) 1.25 MG (50000 UNIT) Caps capsule Commonly known as: DRISDOL  Take 1 capsule (50,000 Units total) by mouth every 7 (seven) days.        Diagnostic Studies: DG Shoulder Left Port Result Date: 02/18/2024 EXAM: 1 VIEW(S) XRAY OF THE LEFT SHOULDER 02/18/2024 01:40:00 PM COMPARISON: CT left shoulder dated 01/06/2024. CLINICAL HISTORY: Status post shoulder surgery. FINDINGS: BONES AND JOINTS: Left shoulder arthroplasty. No acute fracture. The [facility] joint is unremarkable. SOFT TISSUES: Subcutaneous gas noted. No abnormal calcifications. Left basilar linear atelectasis. IMPRESSION: 1. Left shoulder arthroplasty, in satisfactory position. Electronically signed by: Pinkie Pebbles MD MD 02/18/2024 07:32 PM EST RP Workstation: [id number]    Disposition: Discharge disposition: 01-Home or Self Care       Discharge Instructions     Call MD / Call 911   Complete by: As directed    If you experience chest pain or shortness of breath, CALL 911 and be transported to the hospital emergency room.  If you develope a fever above 101 F, pus (white drainage) or increased drainage or redness at the wound, or calf pain, call your surgeon's office.   Constipation Prevention   Complete by: As directed    Drink plenty of fluids.  Prune juice may be helpful.  You may use a stool softener, such as Colace (over the counter) 100 mg twice a day.  Use MiraLax (over the counter) for constipation as needed.   Discharge instructions   Complete by: As directed    1 continue with OT exercises 3 times a day. 2 okay to come out of the sling daily to straighten out the elbow 3 okay to shower dressing is waterproof 4  no lifting with left arm.  Move the shoulder is much as possible when you are out of the sling. 5 home health occupational therapy will come twice a week for the next 2 weeks to work on range of motion exercises   Increase activity slowly as tolerated   Complete by: As directed    Post-operative opioid taper instructions:   Complete by: As directed    POST-OPERATIVE OPIOID TAPER INSTRUCTIONS: It is important to wean off of your opioid  medication as soon as possible. If you do not need pain medication after your surgery it is ok to stop day one. Opioids include: Codeine, Hydrocodone(Norco, Vicodin), Oxycodone (Percocet, oxycontin ) and hydromorphone  amongst others.  Long term and even short term use of opiods can cause: Increased pain response Dependence Constipation Depression Respiratory depression And more.  Withdrawal symptoms can include Flu like symptoms Nausea, vomiting And more Techniques to manage these symptoms Hydrate well Eat regular healthy meals Stay active Use relaxation techniques(deep breathing, meditating, yoga) Do Not substitute Alcohol to help with tapering If you have been on opioids for less than two weeks and do not have pain than it is ok to stop all together.  Plan to wean off of opioids This plan should start within one week post op of your joint replacement. Maintain the same interval or time between taking each dose and first decrease the dose.  Cut the total daily intake of opioids by one tablet each day Next start to increase the time between doses. The last dose that should be eliminated is the evening dose.           Contact information for follow-up providers     Rejeana Fadness, Carlin CROME, PA-C. Go on 03/04/2024.   Specialty: Orthopedic Surgery Why: at 10:30 am for your first in office appointment with Herlene, Dr. Brion PA. Contact information: [address] [place]  [place] [address] [phone]              Contact information for  after-discharge care     Home Medical Care     [id number]) .   Service: Home Health Services                      Signed: Herlene Calix 02/28/2024, 6:04 PM

## 2024-02-29 ENCOUNTER — Ambulatory Visit: Payer: Self-pay

## 2024-02-29 NOTE — Telephone Encounter (Signed)
 I spoke to pt and she states she still has a cough and is hoarse, denies fever. Offered appt in office today but pt declined. Advised pt she would ntbs in office for evaluation if she felt she needs medication called in and pt voiced understanding and will schedule appt if she gets worse.

## 2024-02-29 NOTE — Telephone Encounter (Signed)
 FYI Only or Action Required?: Action required by provider: update on patient condition and requesting abx..  Patient was last seen in primary care on 07/15/2023 by [name], MD.  Called Nurse Triage reporting Cough.  Symptoms began 02/22/24.  Interventions attempted: OTC medications: Mucinex  DM, Robitussin, cough drops.  Symptoms are: gradually worsening.  Triage Disposition: Home Care (pt refused office visit- stated that she was not able to come in and wanted abx called in)- pt does not have cell phone  Patient/caregiver understands and will follow disposition?:  Copied from CRM #[id number]. Topic: Clinical - Red Word Triage >> Feb 24, 2024 11:01 AM Rachael Bryant wrote: Red Word that prompted transfer to Nurse Triage: Colored phlegm, just had shoulder surgery a week ago. Sore throat, chest congestion, loss/hoarse voice. >> Feb 29, 2024  7:49 AM [name] wrote: Colored phlegm, just had shoulder surgery a week ago. Sore throat, chest congestion, loss/hoarse voice.SABRA slight fever / chapped lips.SABRA looking for antibiotics Reason for Disposition  Cough  Answer Assessment - Initial Assessment Questions 1. ONSET: When did the cough begin?      02/22/23 2. SEVERITY: How bad is the cough today?      If talks or eat or with deep breath unable to sleep  3. SPUTUM: Describe the color of your sputum (e.g., none, dry cough; clear, white, yellow, green)     Brown and green and chunky  4. HEMOPTYSIS: Are you coughing up any blood? If Yes, ask: How much? (e.g., flecks, streaks, tablespoons, etc.)     no 5. DIFFICULTY BREATHING: Are you having difficulty breathing? If Yes, ask: How bad is it? (e.g., mild, moderate, severe)      no 6. FEVER: Do you have a fever? If Yes, ask: What is your temperature, how was it measured, and when did it start?     no 7. CARDIAC HISTORY: Do you have any history of heart disease? (e.g., heart attack, congestive heart failure)      no 8. LUNG  HISTORY: Do you have any history of lung disease?  (e.g., pulmonary embolus, asthma, emphysema)     no 9. PE RISK FACTORS: Do you have a history of blood clots? (or: recent major surgery, recent prolonged travel, bedridden)     N/a 10. OTHER SYMPTOMS: Do you have any other symptoms? (e.g., runny nose, wheezing, chest pain)       Sore chest , runny nose, cough, hoarse  Protocols used: Cough - Acute Productive-A-AH

## 2024-03-01 NOTE — Telephone Encounter (Signed)
 Error

## 2024-03-03 NOTE — Progress Notes (Signed)
 Rachael Bryant                                          MRN: [id number]   03/03/2024   The [facility] Quality Team Specialist reviewed this patient medical record for the purposes of chart review for care gap closure. The following were reviewed: chart review for care gap closure-breast cancer screening.    [facility] Quality Team

## 2024-03-04 ENCOUNTER — Other Ambulatory Visit

## 2024-03-04 ENCOUNTER — Telehealth: Payer: Self-pay

## 2024-03-04 ENCOUNTER — Ambulatory Visit: Admitting: Surgical

## 2024-03-04 DIAGNOSIS — Z96612 Presence of left artificial shoulder joint: Secondary | ICD-10-CM | POA: Diagnosis not present

## 2024-03-04 NOTE — Telephone Encounter (Signed)
 Ellin from [facility] called wanting to know the protocal for left arm. She needs to know what she can and can;t do with it. Call back number is [phone].

## 2024-03-06 ENCOUNTER — Other Ambulatory Visit: Payer: Self-pay

## 2024-03-06 ENCOUNTER — Encounter: Payer: Self-pay | Admitting: Surgical

## 2024-03-06 DIAGNOSIS — Z96612 Presence of left artificial shoulder joint: Secondary | ICD-10-CM

## 2024-03-06 NOTE — Progress Notes (Signed)
 "  Post-Op Visit Note   Patient: Rachael Bryant           Date of Birth: 1950/10/20           MRN: [id number] Visit Date: 03/04/2024 PCP: [name], MD   Assessment & Plan:  Chief Complaint:  Chief Complaint  Patient presents with   Left Shoulder - Routine Post Op   Visit Diagnoses:  1. S/P reverse total shoulder arthroplasty, left     Plan: Rachael Bryant is a 74 y.o. female who presents s/p left reverse shoulder arthroplasty on 02/18/2024.  Patient is doing well and pain is overall controlled.  Using CPM machine as instructed.  Denies any chest pain, SOB, fevers, chills.  No complaint of any instability symptoms.  Describes really no pain at all at this point..    On exam, patient has range of motion 10 degrees X rotation, 70 degrees abduction, 100 degrees forward elevation passively.  Intact EPL, FPL, finger abduction, finger adduction, pronation/supination, bicep, tricep, deltoid of operative extremity.  Axillary nerve intact with deltoid firing.  Incision is healing well without evidence of infection or dehiscence.  Incision was made sure to be covered with Steri-Strips from the proximal to distal aspect of the length of the incision.  2+ radial pulse of the operative extremity  Plan is discontinue sling.  Okay to very lightly lift with the operative extremity but no lifting anything heavier than a coffee cup or cell phone.  Start physical therapy to focus on passive range of motion and active range of motion with deltoid isometrics.  Do not want to externally rotate past 30 degrees to protect subscapularis repair.  Follow-up in 4 weeks for clinical recheck with Dr. Addie.  Follow-Up Instructions: No follow-ups on file.   Orders:  Orders Placed This Encounter  Procedures   XR Shoulder Left   No orders of the defined types were placed in this encounter.   Imaging: No results found.  PMFS History: Patient Active Problem List   Diagnosis Date Noted   Arthritis of  left shoulder 02/28/2024   S/P reverse total shoulder arthroplasty, left 02/18/2024   Insect bite of lower back 06/22/2023   Vitamin D  deficiency 10/02/2021   Change in bowel movement 01/10/2021   Acute bilateral low back pain without sciatica 07/03/2020   Age-related osteoporosis without current pathological fracture 02/25/2018   Menopausal syndrome 01/11/2018   Elevated hemoglobin A1c 11/27/2014   Essential tremor 09/04/2014   Insomnia 09/04/2014   Anxiety state 09/04/2014   Depression 09/04/2014   Dyslipidemia 05/01/2014   Upper respiratory infection, viral 01/30/2014   Anxiety, generalized 01/02/2014   Past Medical History:  Diagnosis Date   Anxiety    Benign essential tremor    takes propranolol    High cholesterol    Nodular basal cell carcinoma (BCC) 05/02/2020   Left Alar Crease   PONV (postoperative nausea and vomiting)    vomiting every time after anesthesia    Family History  Problem Relation Age of Onset   Heart attack Father    Diabetes Mother    Pulmonary fibrosis Mother    Parkinsonism Sister     Past Surgical History:  Procedure Laterality Date   CATARACT EXTRACTION Bilateral    COLONOSCOPY WITH PROPOFOL  N/A 01/15/2021   Procedure: COLONOSCOPY WITH PROPOFOL ;  Surgeon: Eartha Angelia Sieving, MD;  [facility];  Service: Gastroenterology;  Laterality: N/A;  11:15   POLYPECTOMY  01/15/2021   Procedure: POLYPECTOMY;  Surgeon:  Eartha Angelia Sieving, MD;  Location: [facility];  Service: Gastroenterology;;   REVERSE SHOULDER ARTHROPLASTY Left 02/18/2024   Procedure: LEFT REVERSE SHOULDER ARTHROPLASTY, BICEPS TENODESIS;  Surgeon: Addie Cordella Hamilton, MD;  Location: [facility] OR;  Service: Orthopedics;  Laterality: Left;   TUBAL LIGATION     Social History   Occupational History   Occupation: retired  Tobacco Use   Smoking status: Never   Smokeless tobacco: Never  Vaping Use   Vaping status: Never Used  Substance and Sexual Activity   Alcohol  use: No    Alcohol/week: 0.0 standard drinks of alcohol   Drug use: No   Sexual activity: Not on file    "

## 2024-03-07 ENCOUNTER — Ambulatory Visit: Admitting: Physical Therapy

## 2024-03-08 ENCOUNTER — Encounter (INDEPENDENT_AMBULATORY_CARE_PROVIDER_SITE_OTHER): Admitting: Ophthalmology

## 2024-03-08 NOTE — Telephone Encounter (Signed)
 Called and advised.

## 2024-03-08 NOTE — Telephone Encounter (Signed)
 She is okay for active assisted range of motion and progress to full active range of motion of the operative shoulder.  Okay for deltoid isometrics at this time and okay to start strengthening around 4 weeks postop to start at 1 pound weights and okay to progress to lifting 5 pounds around 6 to 8 weeks postop at the soonest.  Would include scapular strengthening exercises as well to begin around 6 weeks postop.  Okay for full range of motion but no external rotation past 40 degrees and no range of motion behind the back yet

## 2024-03-10 ENCOUNTER — Other Ambulatory Visit: Payer: Self-pay | Admitting: Family Medicine

## 2024-03-15 ENCOUNTER — Ambulatory Visit

## 2024-03-15 ENCOUNTER — Encounter (INDEPENDENT_AMBULATORY_CARE_PROVIDER_SITE_OTHER): Admitting: Ophthalmology

## 2024-03-16 ENCOUNTER — Ambulatory Visit

## 2024-03-16 ENCOUNTER — Other Ambulatory Visit: Payer: Self-pay

## 2024-03-16 ENCOUNTER — Ambulatory Visit: Admitting: Physical Therapy

## 2024-03-16 DIAGNOSIS — G8929 Other chronic pain: Secondary | ICD-10-CM

## 2024-03-16 DIAGNOSIS — M25612 Stiffness of left shoulder, not elsewhere classified: Secondary | ICD-10-CM

## 2024-03-16 NOTE — Therapy (Signed)
 " OUTPATIENT PHYSICAL THERAPY SHOULDER EVALUATION   Patient Name: Rachael Bryant MRN: [id number] DOB:[birth year], 74 y.o., female Today's Date: 03/16/2024  END OF SESSION:  PT End of Session - 03/16/24 1249     Visit Number 1    Number of Visits 12    Date for Recertification  04/27/24    PT Start Time 1100    PT Stop Time 1143    PT Time Calculation (min) 43 min    Activity Tolerance Patient tolerated treatment well    Behavior During Therapy [facility] for tasks assessed/performed          Past Medical History:  Diagnosis Date   Anxiety    Benign essential tremor    takes propranolol    High cholesterol    Nodular basal cell carcinoma (BCC) 05/02/2020   Left Alar Crease   PONV (postoperative nausea and vomiting)    vomiting every time after anesthesia   Past Surgical History:  Procedure Laterality Date   CATARACT EXTRACTION Bilateral    COLONOSCOPY WITH PROPOFOL  N/A 01/15/2021   Procedure: COLONOSCOPY WITH PROPOFOL ;  Surgeon: Eartha Angelia Sieving, MD;  Location: [facility];  Service: Gastroenterology;  Laterality: N/A;  11:15   POLYPECTOMY  01/15/2021   Procedure: POLYPECTOMY;  Surgeon: Eartha Angelia Sieving, MD;  Location: [facility];  Service: Gastroenterology;;   REVERSE SHOULDER ARTHROPLASTY Left 02/18/2024   Procedure: LEFT REVERSE SHOULDER ARTHROPLASTY, BICEPS TENODESIS;  Surgeon: Addie Cordella Hamilton, MD;  Location: [facility] OR;  Service: Orthopedics;  Laterality: Left;   TUBAL LIGATION     Patient Active Problem List   Diagnosis Date Noted   Arthritis of left shoulder 02/28/2024   S/P reverse total shoulder arthroplasty, left 02/18/2024   Insect bite of lower back 06/22/2023   Vitamin D  deficiency 10/02/2021   Change in bowel movement 01/10/2021   Acute bilateral low back pain without sciatica 07/03/2020   Age-related osteoporosis without current pathological fracture 02/25/2018   Menopausal syndrome 01/11/2018   Elevated hemoglobin A1c 11/27/2014    Essential tremor 09/04/2014   Insomnia 09/04/2014   Anxiety state 09/04/2014   Depression 09/04/2014   Dyslipidemia 05/01/2014   Upper respiratory infection, viral 01/30/2014   Anxiety, generalized 01/02/2014     REFERRING PROVIDER: Cordella Hamilton Addie MD  REFERRING DIAG: Left reveres total shoulder replacement.    THERAPY DIAG:  Chronic left shoulder pain  Stiffness of left shoulder, not elsewhere classified  Rationale for Evaluation and Treatment: Rehabilitation  ONSET DATE: 02/18/24 (surgery date).    SUBJECTIVE:  SUBJECTIVE STATEMENT: The patient presents to the clinic s/p left reverse total shoulder replacement performed on 02/18/24.  She is out of her sling.  She has been performing the pendulum exercise at home.  Her pain is a low 1-2/10.  Her steri-strips are intact.    PERTINENT HISTORY: She above.    PAIN:  Are you having pain? Yes: NPRS scale: 1-2/10.   Pain location: Left shoulder. Pain description: Ache.   Aggravating factors: Movement.   Relieving factors: Rest.    PRECAUTIONS: Passive range of motion and active range of motion with deltoid isometrics. Do not want to externally rotate past 30 degrees to protect subscapularis repair.  NO ultrasound.    RED FLAGS: None   WEIGHT BEARING RESTRICTIONS: Avoid left UE weight bearing.   FALLS:  Has patient fallen in last 6 months? No  LIVING ENVIRONMENT: Lives in: House/apartment Has following equipment at home: None  OCCUPATION: Retired.    PLOF: Independent  PATIENT GOALS:  Use left UE without pain.    NEXT MD VISIT:   OBJECTIVE:   PATIENT SURVEYS:  QUICKDASH:  22.7% (21 points).     UPPER EXTREMITY ROM:   In supine:  Gentle passive left shoulder flexion to 75 degrees and ER to -25 degrees from neutral.    UPPER  EXTREMITY MMT:  NT PALPATION:  C/o posterior left shoulder pain.                                                                                                                               TREATMENT DATE: 03/16/24:  Gentle passive left shoulder range of motion x 9 minutes.     PATIENT EDUCATION: Education details: See below.   Person educated: Patient Education method: Explanation, Demonstration, Tactile cues, Verbal cues, and Handouts Education comprehension: verbalized understanding, returned demonstration, verbal cues required, and tactile cues required  HOME EXERCISE PROGRAM: CANE CANE  [[id number]]  Seated Wand UE External Rotation -  Repeat 10 Repetitions, Hold 10 Seconds, Complete 2 Sets, Perform 3 Times a Day  WAND EXTERNAL ROTATION - SUPINE ER -  Repeat 10 Repetitions, Hold 10 Seconds, Complete 2 Sets, Perform 4 Times a Day  ASSESSMENT:  CLINICAL IMPRESSION: The patient presents to OPPT s/p eft reverse total shoulder replacement performed on 02/18/24.  She has limited range of motion currently.  Her steri-strips are intact.  She has been performing the pendulum exercise at home.  Her QUICKDASH score is QUICKDASH:  22.7% (21 points).              Patient will benefit from skilled PT intervention to address pain and deficits.    OBJECTIVE IMPAIRMENTS: decreased activity tolerance, decreased ROM, and pain.   ACTIVITY LIMITATIONS: carrying, lifting, bending, and reach over head  PARTICIPATION LIMITATIONS: meal prep, cleaning, and laundry  PERSONAL FACTORS: Time since onset of injury/illness/exacerbation are also affecting patient's functional outcome.   REHAB POTENTIAL: Good  CLINICAL DECISION MAKING: Stable/uncomplicated  EVALUATION COMPLEXITY: Low   GOALS:  SHORT TERM GOALS: Target date: 03/30/24  Ind with an initial HEP. Goal status: INITIAL   LONG TERM GOALS: Target date: 04/27/24  (actual target date in accord with MD/protocol guidelines).    Ind with an  advanced HEP.  Goal status: INITIAL  2.  Active left shoulder flexion to 135 degrees so the patient can easily reach overhead.  Goal status: INITIAL  3.  Perform ADL's with left shoulder pain not > 3/10.  Goal status: INITIAL  4.  Improve QUICKDASH by at least 5 points.  Goal status: INITIAL  PLAN:  PT FREQUENCY: 2x/week  PT DURATION: 6 weeks  PLANNED INTERVENTIONS: 97110-Therapeutic exercises, 97530- Therapeutic activity, [id number]- Neuromuscular re-education, 97535- Self Care, [id number]- Manual therapy, [id number]- Electrical stimulation (unattended), 97016- Vasopneumatic device, Patient/Family education, and Cryotherapy  PLAN FOR NEXT SESSION: Begin with PROM.  See precautions above.  Seated UE Ranger.     Bryann Gentz, PT 03/16/2024, 1:09 PM  "

## 2024-03-21 ENCOUNTER — Ambulatory Visit: Admitting: *Deleted

## 2024-03-22 ENCOUNTER — Encounter (INDEPENDENT_AMBULATORY_CARE_PROVIDER_SITE_OTHER): Admitting: Ophthalmology

## 2024-03-24 ENCOUNTER — Ambulatory Visit: Admitting: Physical Therapy

## 2024-03-30 ENCOUNTER — Encounter: Admitting: Surgical

## 2024-05-18 ENCOUNTER — Ambulatory Visit
